# Patient Record
Sex: Male | Born: 1940 | Race: White | Hispanic: No | Marital: Married | Smoking: Former smoker
Health system: Southern US, Community
[De-identification: ages and names within clinical notes are randomized; demographics above are authoritative.]

## PROBLEM LIST (undated history)

## (undated) DIAGNOSIS — Z951 Presence of aortocoronary bypass graft: Secondary | ICD-10-CM

## (undated) DIAGNOSIS — Z789 Other specified health status: Secondary | ICD-10-CM

## (undated) DIAGNOSIS — R002 Palpitations: Secondary | ICD-10-CM

## (undated) DIAGNOSIS — Z87828 Personal history of other (healed) physical injury and trauma: Secondary | ICD-10-CM

## (undated) DIAGNOSIS — Z973 Presence of spectacles and contact lenses: Secondary | ICD-10-CM

## (undated) DIAGNOSIS — K08109 Complete loss of teeth, unspecified cause, unspecified class: Secondary | ICD-10-CM

## (undated) DIAGNOSIS — M1990 Unspecified osteoarthritis, unspecified site: Secondary | ICD-10-CM

## (undated) DIAGNOSIS — Z9889 Other specified postprocedural states: Secondary | ICD-10-CM

## (undated) DIAGNOSIS — I719 Aortic aneurysm of unspecified site, without rupture: Secondary | ICD-10-CM

## (undated) DIAGNOSIS — IMO0002 Reserved for concepts with insufficient information to code with codable children: Secondary | ICD-10-CM

## (undated) DIAGNOSIS — E119 Type 2 diabetes mellitus without complications: Secondary | ICD-10-CM

## (undated) DIAGNOSIS — I10 Essential (primary) hypertension: Secondary | ICD-10-CM

## (undated) DIAGNOSIS — G8929 Other chronic pain: Secondary | ICD-10-CM

## (undated) DIAGNOSIS — K5909 Other constipation: Secondary | ICD-10-CM

## (undated) DIAGNOSIS — K219 Gastro-esophageal reflux disease without esophagitis: Secondary | ICD-10-CM

## (undated) DIAGNOSIS — Z87898 Personal history of other specified conditions: Secondary | ICD-10-CM

## (undated) DIAGNOSIS — C61 Malignant neoplasm of prostate: Secondary | ICD-10-CM

## (undated) DIAGNOSIS — Z974 Presence of external hearing-aid: Secondary | ICD-10-CM

## (undated) DIAGNOSIS — Z9861 Coronary angioplasty status: Secondary | ICD-10-CM

## (undated) DIAGNOSIS — M549 Dorsalgia, unspecified: Secondary | ICD-10-CM

## (undated) DIAGNOSIS — I2510 Atherosclerotic heart disease of native coronary artery without angina pectoris: Secondary | ICD-10-CM

## (undated) DIAGNOSIS — E785 Hyperlipidemia, unspecified: Secondary | ICD-10-CM

## (undated) DIAGNOSIS — G894 Chronic pain syndrome: Secondary | ICD-10-CM

## (undated) HISTORY — PX: CORONARY ARTERY BYPASS GRAFT: SHX141

## (undated) HISTORY — DX: Essential (primary) hypertension: I10

## (undated) HISTORY — PX: LUMBAR SPINE SURGERY: SHX701

## (undated) HISTORY — DX: Atherosclerotic heart disease of native coronary artery without angina pectoris: I25.10

## (undated) HISTORY — PX: CORONARY ANGIOPLASTY: SHX604

## (undated) HISTORY — PX: CARDIAC CATHETERIZATION: SHX172

## (undated) HISTORY — DX: Reserved for concepts with insufficient information to code with codable children: IMO0002

## (undated) HISTORY — DX: Coronary angioplasty status: Z98.61

## (undated) HISTORY — PX: OTHER SURGICAL HISTORY: SHX169

## (undated) HISTORY — PX: ESOPHAGOGASTRODUODENOSCOPY (EGD) WITH ESOPHAGEAL DILATION: SHX5812

## (undated) HISTORY — PX: TONSILLECTOMY: SUR1361

## (undated) HISTORY — DX: Dorsalgia, unspecified: M54.9

## (undated) HISTORY — DX: Other chronic pain: G89.29

## (undated) HISTORY — DX: Personal history of other specified conditions: Z87.898

## (undated) HISTORY — DX: Hyperlipidemia, unspecified: E78.5

## (undated) HISTORY — DX: Other specified health status: Z78.9

## (undated) HISTORY — DX: Presence of aortocoronary bypass graft: Z95.1

## (undated) HISTORY — PX: KNEE ARTHROSCOPY: SUR90

---

## 1970-04-23 HISTORY — PX: OTHER SURGICAL HISTORY: SHX169

## 1986-04-23 HISTORY — PX: CORONARY ANGIOPLASTY: SHX604

## 1994-06-22 DIAGNOSIS — Z951 Presence of aortocoronary bypass graft: Secondary | ICD-10-CM | POA: Insufficient documentation

## 1994-06-22 HISTORY — DX: Presence of aortocoronary bypass graft: Z95.1

## 1994-07-04 HISTORY — PX: CARDIAC CATHETERIZATION: SHX172

## 1994-07-05 HISTORY — PX: CORONARY ARTERY BYPASS GRAFT: SHX141

## 2000-07-26 ENCOUNTER — Encounter: Payer: Self-pay | Admitting: Orthopedic Surgery

## 2000-07-26 ENCOUNTER — Encounter: Admission: RE | Admit: 2000-07-26 | Discharge: 2000-07-26 | Payer: Self-pay | Admitting: Orthopedic Surgery

## 2000-07-29 ENCOUNTER — Ambulatory Visit (HOSPITAL_BASED_OUTPATIENT_CLINIC_OR_DEPARTMENT_OTHER): Admission: RE | Admit: 2000-07-29 | Discharge: 2000-07-29 | Payer: Self-pay | Admitting: Orthopedic Surgery

## 2001-01-14 ENCOUNTER — Ambulatory Visit (HOSPITAL_COMMUNITY): Admission: RE | Admit: 2001-01-14 | Discharge: 2001-01-14 | Payer: Self-pay | Admitting: Cardiology

## 2001-01-14 ENCOUNTER — Encounter: Payer: Self-pay | Admitting: Cardiology

## 2004-01-11 ENCOUNTER — Ambulatory Visit (HOSPITAL_COMMUNITY): Admission: RE | Admit: 2004-01-11 | Discharge: 2004-01-11 | Payer: Self-pay | Admitting: Cardiology

## 2005-04-13 ENCOUNTER — Ambulatory Visit (HOSPITAL_COMMUNITY): Admission: RE | Admit: 2005-04-13 | Discharge: 2005-04-13 | Payer: Self-pay | Admitting: Cardiology

## 2005-05-22 ENCOUNTER — Ambulatory Visit (HOSPITAL_COMMUNITY): Admission: RE | Admit: 2005-05-22 | Discharge: 2005-05-22 | Payer: Self-pay | Admitting: Gastroenterology

## 2008-06-21 HISTORY — PX: NM MYOVIEW LTD: HXRAD82

## 2012-07-01 ENCOUNTER — Other Ambulatory Visit (HOSPITAL_COMMUNITY): Payer: Self-pay | Admitting: Cardiology

## 2012-07-01 DIAGNOSIS — I2510 Atherosclerotic heart disease of native coronary artery without angina pectoris: Secondary | ICD-10-CM

## 2012-07-01 DIAGNOSIS — I719 Aortic aneurysm of unspecified site, without rupture: Secondary | ICD-10-CM

## 2012-07-22 ENCOUNTER — Ambulatory Visit (HOSPITAL_COMMUNITY)
Admission: RE | Admit: 2012-07-22 | Discharge: 2012-07-22 | Disposition: A | Discharge status: Home / Self Care | Payer: Medicare Other | Source: Ambulatory Visit | Attending: Cardiology | Admitting: Cardiology

## 2012-07-22 DIAGNOSIS — E663 Overweight: Secondary | ICD-10-CM | POA: Insufficient documentation

## 2012-07-22 DIAGNOSIS — I10 Essential (primary) hypertension: Secondary | ICD-10-CM | POA: Insufficient documentation

## 2012-07-22 DIAGNOSIS — I2510 Atherosclerotic heart disease of native coronary artery without angina pectoris: Secondary | ICD-10-CM | POA: Insufficient documentation

## 2012-07-22 DIAGNOSIS — R5383 Other fatigue: Secondary | ICD-10-CM | POA: Insufficient documentation

## 2012-07-22 DIAGNOSIS — R5381 Other malaise: Secondary | ICD-10-CM | POA: Insufficient documentation

## 2012-07-22 DIAGNOSIS — I719 Aortic aneurysm of unspecified site, without rupture: Secondary | ICD-10-CM

## 2012-07-22 HISTORY — PX: OTHER SURGICAL HISTORY: SHX169

## 2012-07-22 MED ORDER — TECHNETIUM TC 99M SESTAMIBI GENERIC - CARDIOLITE
10.4000 | Freq: Once | INTRAVENOUS | Status: AC | PRN
  Administered 2012-07-22: 10 via INTRAVENOUS

## 2012-07-22 MED ORDER — REGADENOSON 0.4 MG/5ML IV SOLN
0.4000 mg | Freq: Once | INTRAVENOUS | Status: AC
  Administered 2012-07-22: 0.4 mg via INTRAVENOUS

## 2012-07-22 MED ORDER — TECHNETIUM TC 99M SESTAMIBI GENERIC - CARDIOLITE
30.5000 | Freq: Once | INTRAVENOUS | Status: AC | PRN
  Administered 2012-07-22: 31 via INTRAVENOUS

## 2012-07-22 NOTE — Procedures (Addendum)
[facility] [address] [place] [address] [phone]  Cardiology Nuclear Med Study  Sean Lucas is a 72 y.o. male     MRN : [id number]     DOB: 1940-12-15  Procedure Date: 07/22/2012  Nuclear Med Background Indication for Stress Test:  Graft Patency History:  CAD;MI;CABG X3-06/1994;STENT/PTCA-1988 Cardiac Risk Factors: Family History - CAD, History of Smoking, Hypertension, Lipids and Overweight  Symptoms:  Fatigue   Nuclear Pre-Procedure Caffeine/Decaff Intake:  12:00am NPO After: 10 AM   IV Site: R Antecubital  IV 0.9% NS with Angio Cath:  22g  Chest Size (in):  44" IV Started by: Sean Pomfret, RN  Height: 5\' 9"  (1.753 m)  Cup Size: n/a  BMI:  Body mass index is 28.64 kg/(m^2). Weight:  194 lb (87.998 kg)   Tech Comments:  N/A    Nuclear Med Study 1 or 2 day study: 1 day  Stress Test Type:  Lexiscan  Order Authorizing Provider:  Bryan Lemma, MD   Resting Radionuclide: Technetium 95m Sestamibi  Resting Radionuclide Dose: 10.4 mCi   Stress Radionuclide:  Technetium 102m Sestamibi  Stress Radionuclide Dose: 30.5 mCi           Stress Protocol Rest HR: 58 Stress HR: 76  Rest BP: 143/71 Stress BP: 148/63  Exercise Time (min): n/a METS: n/a   Predicted Max HR: 149 bpm % Max HR: 51.01 bpm Rate Pressure Product: 21308  Dose of Adenosine (mg):  n/a Dose of Lexiscan: 0.4 mg  Dose of Atropine (mg): n/a Dose of Dobutamine: n/a mcg/kg/min (at max HR)  Stress Test Technologist: Sean Lucas, CCT Nuclear Technologist: Sean Lucas, CNMT   Rest Procedure:  Myocardial perfusion imaging was performed at rest 45 minutes following the intravenous administration of Technetium 90m Sestamibi. Stress Procedure:  The patient received IV Lexiscan 0.4 mg over 15-seconds.  Technetium 53m Sestamibi injected at 30-seconds.  There were no significant changes with Lexiscan. The Patient experienced chest pressure and SOB; 75  mg. Of IV Aminophylline was administered with resolution of symptoms.  Quantitative spect images were obtained after a 45 minute delay.  Transient Ischemic Dilatation (Normal <1.22):  1.10 Lung/Heart Ratio (Normal <0.45):  0.29 QGS EDV:  69 ml QGS ESV:  27 ml LV Ejection Fraction: 61%  Signed by      Rest ECG: NSR - Normal EKG  Stress ECG: No significant change from baseline ECG  QPS Raw Data Images:  Normal; no motion artifact; normal heart/lung ratio. Stress Images:  Normal homogeneous uptake in all areas of the myocardium. Rest Images:  Normal homogeneous uptake in all areas of the myocardium. Subtraction (SDS):  No evidence of ischemia.  Impression Exercise Capacity:  Lexiscan with no exercise. BP Response:  Normal blood pressure response. Clinical Symptoms:  Mild chest pain/dyspnea. ECG Impression:  No significant ST segment change suggestive of ischemia. Comparison with Prior Nuclear Study: Compared to 2010, the previously described area of anterior wall "thinning" is not currently seen.  Overall Impression:  Normal stress nuclear study.  LV Wall Motion:  NL LV Function; NL Wall Motion   Sean Nass, MD  07/22/2012 5:37 PM

## 2012-11-18 ENCOUNTER — Emergency Department (HOSPITAL_COMMUNITY)
Admission: EM | Admit: 2012-11-18 | Discharge: 2012-11-18 | Disposition: A | Discharge status: Home / Self Care | Payer: Medicare Other | Attending: Emergency Medicine | Admitting: Emergency Medicine

## 2012-11-18 ENCOUNTER — Encounter (HOSPITAL_COMMUNITY): Payer: Self-pay | Admitting: Emergency Medicine

## 2012-11-18 ENCOUNTER — Emergency Department (HOSPITAL_COMMUNITY): Payer: Medicare Other

## 2012-11-18 DIAGNOSIS — R002 Palpitations: Secondary | ICD-10-CM | POA: Insufficient documentation

## 2012-11-18 DIAGNOSIS — Z951 Presence of aortocoronary bypass graft: Secondary | ICD-10-CM | POA: Insufficient documentation

## 2012-11-18 DIAGNOSIS — I10 Essential (primary) hypertension: Secondary | ICD-10-CM | POA: Insufficient documentation

## 2012-11-18 DIAGNOSIS — I2510 Atherosclerotic heart disease of native coronary artery without angina pectoris: Secondary | ICD-10-CM | POA: Insufficient documentation

## 2012-11-18 DIAGNOSIS — Z79899 Other long term (current) drug therapy: Secondary | ICD-10-CM | POA: Insufficient documentation

## 2012-11-18 DIAGNOSIS — Z8679 Personal history of other diseases of the circulatory system: Secondary | ICD-10-CM | POA: Insufficient documentation

## 2012-11-18 DIAGNOSIS — Z7982 Long term (current) use of aspirin: Secondary | ICD-10-CM | POA: Insufficient documentation

## 2012-11-18 DIAGNOSIS — Z791 Long term (current) use of non-steroidal anti-inflammatories (NSAID): Secondary | ICD-10-CM | POA: Insufficient documentation

## 2012-11-18 HISTORY — DX: Aortic aneurysm of unspecified site, without rupture: I71.9

## 2012-11-18 LAB — COMPREHENSIVE METABOLIC PANEL
ALT: 31 U/L (ref 0–53)
AST: 28 U/L (ref 0–37)
Albumin: 4.2 g/dL (ref 3.5–5.2)
Calcium: 10.1 mg/dL (ref 8.4–10.5)
Creatinine, Ser: 1.04 mg/dL (ref 0.50–1.35)
GFR calc non Af Amer: 70 mL/min — ABNORMAL LOW (ref >90)
Sodium: 139 mEq/L (ref 135–145)
Total Protein: 7.6 g/dL (ref 6.0–8.3)

## 2012-11-18 LAB — CBC WITH DIFFERENTIAL/PLATELET
Basophils Absolute: 0.1 10*3/uL (ref 0.0–0.1)
Basophils Relative: 1 % (ref 0–1)
Eosinophils Absolute: 0.4 10*3/uL (ref 0.0–0.7)
Eosinophils Relative: 4 % (ref 0–5)
HCT: 40.7 % (ref 39.0–52.0)
MCHC: 35.6 g/dL (ref 30.0–36.0)
MCV: 86.8 fL (ref 78.0–100.0)
Monocytes Absolute: 1 10*3/uL (ref 0.1–1.0)
Platelets: 284 10*3/uL (ref 150–400)
RDW: 13.7 % (ref 11.5–15.5)
WBC: 10.3 10*3/uL (ref 4.0–10.5)

## 2012-11-18 LAB — POCT I-STAT TROPONIN I

## 2012-11-18 NOTE — ED Provider Notes (Signed)
CSN: [id number]     Arrival date & time 11/18/12  0421 History     First MD Initiated Contact with Patient 11/18/12 [phone]     Chief Complaint  Patient presents with  . Palpitations   (Consider location/radiation/quality/duration/timing/severity/associated sxs/prior Treatment) HPI Comments: 72 year old male with a past medical history of aortic aneurysm and coronary arteriosclerosis status post CABG presents to the emergency department complaining of one episode of palpitations beginning about one hour prior to arrival to the emergency department around 3:00 this morning. Patient states he was laying in bed and felt abnormal heartbeats lasting for less than an hour, the symptoms subsided, however wife advised patient to the emergency department for evaluation. Denies ever having an episode like this in the past. Denies any associated symptoms including lightheadedness, dizziness, nausea, vomiting, diaphoresis, chest pain or shortness of breath. Denies having any stimulants prior to going to bed last night. No new recent medication changes. Last saw a cardiologist Dr. Herbie Baltimore with [facility] about 3 months back, had a normal stress test and was advised to return in one year. Currently patient is asymptomatic. The symptoms have not returned.  Patient is a 72 y.o. male presenting with palpitations. The history is provided by the patient and the spouse.  Palpitations Associated symptoms: no chest pain, no dizziness and no shortness of breath     Past Medical History  Diagnosis Date  . Aortic aneurysm   . Coronary arteriosclerosis    Past Surgical History  Procedure Laterality Date  . Coronary artery bypass graft     No family history on file. History  Substance Use Topics  . Smoking status: Never Smoker   . Smokeless tobacco: Not on file  . Alcohol Use: No    Review of Systems  Respiratory: Negative for shortness of breath.   Cardiovascular: Positive for palpitations.  Negative for chest pain.  Neurological: Negative for dizziness, syncope, weakness and light-headedness.  All other systems reviewed and are negative.    Allergies  Review of patient's allergies indicates no known allergies.  Home Medications   Current Outpatient Rx  Name  Route  Sig  Dispense  Refill  . aspirin 325 MG tablet   Oral   Take 325 mg by mouth daily.         . Cholecalciferol (VITAMIN D-3) 1000 UNITS CAPS   Oral   Take 1,000 Units by mouth daily.         . Coenzyme Q10 (CO Q 10 PO)   Oral   Take 200 mg by mouth daily.         . diazepam (VALIUM) 5 MG tablet   Oral   Take 5 mg by mouth 3 (three) times daily.         . Glucosamine HCl (GLUCOSAMINE PO)   Oral   Take 1,500 mg by mouth daily.         Marland Kitchen HYDROcodone-acetaminophen (NORCO) 7.5-325 MG per tablet   Oral   Take 1 tablet by mouth every 6 (six) hours as needed for pain.         Marland Kitchen lisinopril (PRINIVIL,ZESTRIL) 40 MG tablet   Oral   Take 40 mg by mouth daily.         . meloxicam (MOBIC) 7.5 MG tablet   Oral   Take 7.5 mg by mouth daily.         . niacin 500 MG tablet   Oral   Take 500 mg by mouth 3 (  three) times daily with meals.         . nitroGLYCERIN (NITROSTAT) 0.4 MG SL tablet   Sublingual   Place 0.4 mg under the tongue every 5 (five) minutes x 2 doses as needed for chest pain.         Marland Kitchen omeprazole (PRILOSEC) 20 MG capsule   Oral   Take 20 mg by mouth daily.         . propranolol (INDERAL) 20 MG tablet   Oral   Take 20 mg by mouth 3 (three) times daily.         . rosuvastatin (CRESTOR) 20 MG tablet   Oral   Take 20 mg by mouth daily.         Marland Kitchen venlafaxine (EFFEXOR) 75 MG tablet   Oral   Take 75 mg by mouth daily.          BP 146/75  Pulse 75  Temp(Src) 97.9 F (36.6 C) (Oral)  Resp 18  SpO2 93% Physical Exam  Nursing note and vitals reviewed. Constitutional: He is oriented to person, place, and time. He appears well-developed and  well-nourished. No distress.  HENT:  Head: Normocephalic and atraumatic.  Mouth/Throat: Oropharynx is clear and moist.  Eyes: Conjunctivae and EOM are normal. Pupils are equal, round, and reactive to light.  Neck: Normal range of motion. Neck supple.  Cardiovascular: Normal rate, regular rhythm and normal heart sounds.   Pulmonary/Chest: Effort normal and breath sounds normal.  Abdominal: Soft. Bowel sounds are normal. There is no tenderness.  Musculoskeletal: Normal range of motion. He exhibits no edema.  Neurological: He is alert and oriented to person, place, and time. He has normal strength. No sensory deficit.  Skin: Skin is warm and dry. He is not diaphoretic.  Psychiatric: He has a normal mood and affect. His behavior is normal.    ED Course   Procedures (including critical care time)  Labs Reviewed  COMPREHENSIVE METABOLIC PANEL - Abnormal; Notable for the following:    Glucose, Bld 108 (*)    GFR calc non Af Amer 70 (*)    GFR calc Af Amer 81 (*)    All other components within normal limits  CBC WITH DIFFERENTIAL  POCT I-STAT TROPONIN I    Date: 11/18/2012  Rate: 85  Rhythm: normal sinus rhythm  QRS Axis: normal  Intervals: normal  ST/T Wave abnormalities: normal  Conduction Disutrbances:none  Narrative Interpretation: normal EKG  Old EKG Reviewed: none available   Dg Chest 2 View  11/18/2012   *RADIOLOGY REPORT*  Clinical Data: Palpitations.  Irregular heart rate.  CHEST - 2 VIEW  Comparison: None.  Findings: Cardiomegaly.  CABG/median sternotomy.  No airspace disease.  No effusion.  Aortic arch atherosclerosis.  Thoracic spine degenerative disease.  Surgical clips are present in the upper abdomen.  IMPRESSION: Cardiomegaly without failure.   Original Report Authenticated By: Andreas Newport, M.D.   1. Palpitations     MDM  Patient with palpitations lasting less than 1 hour, asymptomatic in the ED. labs including CBC, BMP, troponin normal. EKG unremarkable.  Chest x-ray clear, cardiomegaly without failure. He will be discharged home with close followup with his cardiologist Dr. Herbie Baltimore for Holter monitor. Patient discussed with Dr. Dierdre Highman who agrees with plan of care.    Trevor Mace, PA-C 11/18/12 [phone]

## 2012-11-18 NOTE — ED Notes (Signed)
PT. REPORTS PALPITATIONS WHILE LYING ON BED THIS MORNING , DENIES CHEST PAIN OR SOB .

## 2012-11-19 NOTE — ED Provider Notes (Signed)
Medical screening examination/treatment/procedure(s) were performed by non-physician practitioner and as supervising physician I was immediately available for consultation/collaboration.  Ayona Yniguez, MD 11/19/12 0443 

## 2012-11-21 ENCOUNTER — Encounter: Payer: Self-pay | Admitting: Cardiology

## 2012-11-21 ENCOUNTER — Ambulatory Visit (INDEPENDENT_AMBULATORY_CARE_PROVIDER_SITE_OTHER): Payer: Medicare Other | Admitting: Cardiology

## 2012-11-21 VITALS — BP 142/82 | HR 63 | Ht 69.0 in | Wt 188.5 lb

## 2012-11-21 DIAGNOSIS — I2510 Atherosclerotic heart disease of native coronary artery without angina pectoris: Secondary | ICD-10-CM

## 2012-11-21 DIAGNOSIS — Z951 Presence of aortocoronary bypass graft: Secondary | ICD-10-CM

## 2012-11-21 DIAGNOSIS — I719 Aortic aneurysm of unspecified site, without rupture: Secondary | ICD-10-CM

## 2012-11-21 DIAGNOSIS — E785 Hyperlipidemia, unspecified: Secondary | ICD-10-CM

## 2012-11-21 DIAGNOSIS — R002 Palpitations: Secondary | ICD-10-CM

## 2012-11-21 DIAGNOSIS — I10 Essential (primary) hypertension: Secondary | ICD-10-CM

## 2012-11-21 NOTE — Patient Instructions (Addendum)
I am going to have you wear a monitor to see if we can figure out what the palpitations are.  You will have it for ~4 weeks unless we find something sooner.  Make sure that you cut back on your caffeine intake & stay hydrated (drink plenty of water).   Marykay Lex, MD

## 2012-11-28 DIAGNOSIS — R002 Palpitations: Secondary | ICD-10-CM

## 2012-12-10 ENCOUNTER — Encounter: Payer: Self-pay | Admitting: Cardiology

## 2012-12-10 DIAGNOSIS — I10 Essential (primary) hypertension: Secondary | ICD-10-CM | POA: Insufficient documentation

## 2012-12-10 DIAGNOSIS — I719 Aortic aneurysm of unspecified site, without rupture: Secondary | ICD-10-CM | POA: Insufficient documentation

## 2012-12-10 DIAGNOSIS — E785 Hyperlipidemia, unspecified: Secondary | ICD-10-CM | POA: Insufficient documentation

## 2012-12-10 DIAGNOSIS — I2510 Atherosclerotic heart disease of native coronary artery without angina pectoris: Secondary | ICD-10-CM | POA: Insufficient documentation

## 2012-12-10 NOTE — Progress Notes (Signed)
Patient ID: Sean Lucas, male   DOB: April 10, 1941, 72 y.o.   MRN: [id number] PCP: Melida Gimenez, MD  Clinic Note: Chief Complaint  Patient presents with  . Follow-up    post hosp ER visit,no chest pain ,no sob, no edema   HPI: Sean Lucas is a 72 y.o. male with a PMH below who presents today for followup of her recent hospital emergency room visit on July 29. He is a former long-term patient of Dr. Caprice Kluver, who I saw for the first time in March of 2014. He noted the baseline mild exertional dyspnea, and mild fatigue. His other major symptom at that time with headache symptoms that were currently being evaluated. As part of his followup evaluation for his coronary disease and with a LexiScan Myoview in April that was notably normal no evidence of ischemia or infarction and normal ejection fraction.  Interval History: He notes he been doing actually relatively well until a couple days before he went to the emergency room he started feeling intermittent episodes of dizziness with some mild palpitation symptoms like he had the past. He denied any true syncope but did feel as if he may pass out. On July 29, he went to the emergency room with the prolonged episode of palpitations lasting about an hour prior to his arrival. By the time he arrived to the emergency room, his symptoms had subsided. He described to me that he had been drinking some sodas during the course of that day, but didn't drink any more than usual. He says that the episodes he had leading up to and the time of his visit or more prominent than any other episodes of palpitations had in the past --they were just mild intermittent palpitations but none that lasted as long. In the emergency room, his heart rate was noted to be in the 55-85 beats per minute range. No arrhythmias noted.  Both now and the time of his evaluation, he denied any chest tightness or pressure associated with it. No dyspnea associated. He still has a mild  exertional dyspnea when he tries to get out and about fashion than usual. He thinks is more limited to the pain in his feet. He did feel a little bit dizzy initially episodes leading up to his presenting symptom, but he was not notably dizzy before going to the ER. Of course when the symptoms came on he was already lying in bed, so therefore he didn't notice if he felt as if he may need to sit down. He denied any true strokelike symptoms or TIA/amaurosis fugax symptoms. He never had any true syncopal type symptoms.  He denies any melena, hematochezia or hematuria, or nosebleeds. No claudication symptoms.  Past Medical History  Diagnosis Date  . Aortic aneurysm     By patient report  . CAD S/P percutaneous coronary angioplasty 1988    RCA PTCA in 1998 -- angina symptom = jaw and tooth pain  . 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 CAD in native artery     Three-vessel disease, normal wall motion normal ejection fraction.  . Dyslipidemia, goal LDL below 70   . Hypertension   . Chronic back pain   . History of palpitations     Related to anxiety, much improved -- true etiology not established  . Statin intolerance    Prior Cardiac Evaluation and Past Surgical History: Past Surgical History  Procedure Laterality  Date  . Coronary artery bypass graft  1996    LIMA-LAD, SVG-OM, SVG-dRCA  . Coronary angioplasty  1988    RCA  . Nm myoview ltd  March 2010    Small sized, mild intensity perfusion defect in the mid anterior wall, no reversibility. Either prior infarct or artifact. No evidence of ischemia  . Nm lexiscan myoview ltd  07/22/2012    Normal homogenous uptake, no ischemia or infarction. Previously noted anterior "thinning "not seen; EF 61%   No Known Allergies  Current Outpatient Prescriptions  Medication Sig Dispense Refill  . aspirin 325 MG tablet Take 325 mg by mouth daily.      . Cholecalciferol (VITAMIN D-3) 1000 UNITS CAPS  Take 1,000 Units by mouth daily.      . Coenzyme Q10 (CO Q 10 PO) Take 200 mg by mouth daily.      . diazepam (VALIUM) 5 MG tablet Take 5 mg by mouth 3 (three) times daily.      . fish oil-omega-3 fatty acids 1000 MG capsule Take 2 g by mouth daily.      . Glucosamine HCl (GLUCOSAMINE PO) Take 1,500 mg by mouth daily.      Marland Kitchen lisinopril (PRINIVIL,ZESTRIL) 40 MG tablet Take 40 mg by mouth daily.      . meloxicam (MOBIC) 7.5 MG tablet Take 7.5 mg by mouth daily.      . Multiple Vitamins-Minerals (MULTIVITAMIN WITH MINERALS) tablet Take 1 tablet by mouth daily.      . niacin 500 MG tablet Take 500 mg by mouth 3 (three) times daily with meals.      . nitroGLYCERIN (NITROSTAT) 0.4 MG SL tablet Place 0.4 mg under the tongue every 5 (five) minutes x 2 doses as needed for chest pain.      Marland Kitchen omeprazole (PRILOSEC) 20 MG capsule Take 20 mg by mouth daily.      . Oxycodone HCl 10 MG TABS       . propranolol (INDERAL) 20 MG tablet Take 20 mg by mouth 3 (three) times daily.      . rosuvastatin (CRESTOR) 20 MG tablet Take 20 mg by mouth daily.      Marland Kitchen venlafaxine (EFFEXOR) 75 MG tablet Take 75 mg by mouth daily.       No current facility-administered medications for this visit.   History   Social History Narrative   He is a married father of 8, grandfather to 37, great-grandfather of 4. He has a lot of difficulty with his feet. Walking on a treadmill really bothers his feet but he does try to be active. He denies any tobacco or alcohol use.     ROS: A comprehensive Review of Systems - Negative except Pertinent positives are noted above, other noncardiac issues below Gastrointestinal ROS: positive for - constipation Neurological ROS: no TIA or stroke symptoms positive for - gait disturbance and impaired coordination/balance; also foot pain is a chronic thing for him as well as back pain. No myalgias or arthralgias besides his back to  PHYSICAL EXAM BP 142/82  Pulse 63  Ht 5\' 9"  (1.753 m)  Wt 188 lb 8  oz (85.503 kg)  BMI 27.82 kg/m2 General, he is a very pleasant, healthy-appearing gentleman. NAD, A&O x 3; answers questions appropriately. He is well nourished, well groomed. HEENT: NCAT. EOMI. MMM. Anicteric sclerae. Neck: Supple with no LAN, JVD, or carotid bruit.  Heart: RRR. Normal S1, S2. No M/R/G. Nondisplaced PMI.  Lungs: CTAB. Nonlabored. Normal effort. Good  air movement.  Abdomen: Soft, nontender, nondistended; notable diastasis recti or possible incisional hernia from his previous surgeries. With this abnormality, it is difficult to palpate any pulsatile mass. Extremities: No C./C./E. Neuro- Psych: He has normal mood and affect, a little bit anxious. Otherwise neurologically intact, grossly normal  RUE:[id number] today: Yes Rate: 63 , Rhythm: Normal sinus, normal ECG.  Recent Labs: Chemistry panel 11/18/2012: Essentially normal, GFR was 70 otherwise normal with the exception of glucose of 108, CBC normal,  ASSESSMENT / PLAN:  Heart palpitations He has had palpitations as in the past, all were associated with anxiety type symptoms. This most recent one is a little different in that it lasted a longer time period he is not had any more episodes since then, so I cannot be sure to actually able to capture an episode on the monitor, however I would like to clarify what the rhythm was. My concern would be either atrial fibrillation or another more ominous rhythm. I don't think he would be consistent with an SVT, as he really didn't note much dizziness.  Plan: CardioNet Event Monitor times one month; followup after complete if any abdominal is noted. If symptoms reoccur outside the window of the monitor, and are concerning enough, would consider loop recorder implant.  S/P CABG x 3 He is remarkably stable overall. His bypass grafts are now close 72 years old, however his stress test remained normal, and he remains symptom free. He is on aspirin, ACE inhibitor and Inderal as a beta blocker.  This too is a beta blocker is to allow for combination treatment of his migraine headaches -- which have improved. He is also on Crestor, which he seems to be able to tolerate. Is also on niacin and fish oil.  Dyslipidemia, goal LDL below 70 Labs have not been checked. Plan was to have them checked at the time of his stress test, unfortunately labs are not in the system.  He would be due for having some labs checked, at least is chemistry panel appeared normal. If he is to see his primary physician in the interim before I see him back, perhaps they can be checked by his primary.  Otherwise we'll recheck him see him back.  He has continued to do well with dietary modifications and trying to pick up his activity level. He has lost another 6 pounds since his last visit. This is almost 10 pounds in about a year. He is still in the "overweight category "but notably improved.  Hypertension Borderline control on his current regimen. I'd like to see what his palpitations are to seek any intestines need to be made from a beta blocker standpoint the blood pressure is much better on last visit, but this is similar to the pressure he had emergency room.  Plan for now would continue to monitor and make further adjustments.  Aortic aneurysm - by report This is a diagnosed it was noted when he went to the emergency room. I was unable to see any documentation of this in our paper chart. This coronary disease, and dyslipidemia at his age it is not unreasonable to consider a screening abdominal ultrasound. I am somewhat concerned that his diastases recti/hernia who will make this difficult to get a good image. I will discuss it with my ultrasound tech, and if she feels it would be a doable thing, I will order it at the time of his next visit.    Orders Placed This Encounter  Procedures  . EKG 12-Lead  .  Cardiac event monitor    Standing Status: Future     Number of Occurrences: 1     Standing Expiration  Date: 11/21/2013    Followup: 6 months, unless any gross abnormalities noted on upcoming monitor.  DAVID W. Herbie Baltimore, M.D., M.S. [facility] [address] [place], [place]  [address]  [phone] Pager # [phone]

## 2012-12-11 DIAGNOSIS — R002 Palpitations: Secondary | ICD-10-CM | POA: Insufficient documentation

## 2012-12-11 NOTE — Assessment & Plan Note (Signed)
He has had palpitations as in the past, all were associated with anxiety type symptoms. This most recent one is a little different in that it lasted a longer time period he is not had any more episodes since then, so I cannot be sure to actually able to capture an episode on the monitor, however I would like to clarify what the rhythm was. My concern would be either atrial fibrillation or another more ominous rhythm. I don't think he would be consistent with an SVT, as he really didn't note much dizziness.  Plan: CardioNet Event Monitor times one month; followup after complete if any abdominal is noted. If symptoms reoccur outside the window of the monitor, and are concerning enough, would consider loop recorder implant.

## 2012-12-11 NOTE — Assessment & Plan Note (Signed)
He is remarkably stable overall. His bypass grafts are now close 72 years old, however his stress test remained normal, and he remains symptom free. He is on aspirin, ACE inhibitor and Inderal as a beta blocker. This too is a beta blocker is to allow for combination treatment of his migraine headaches -- which have improved. He is also on Crestor, which he seems to be able to tolerate. Is also on niacin and fish oil.

## 2012-12-11 NOTE — Assessment & Plan Note (Signed)
Borderline control on his current regimen. I'd like to see what his palpitations are to seek any intestines need to be made from a beta blocker standpoint the blood pressure is much better on last visit, but this is similar to the pressure he had emergency room.  Plan for now would continue to monitor and make further adjustments.

## 2012-12-11 NOTE — Assessment & Plan Note (Addendum)
Labs have not been checked. Plan was to have them checked at the time of his stress test, unfortunately labs are not in the system.  He would be due for having some labs checked, at least is chemistry panel appeared normal. If he is to see his primary physician in the interim before I see him back, perhaps they can be checked by his primary.  Otherwise we'll recheck him see him back.  He has continued to do well with dietary modifications and trying to pick up his activity level. He has lost another 6 pounds since his last visit. This is almost 10 pounds in about a year. He is still in the "overweight category "but notably improved.

## 2012-12-11 NOTE — Assessment & Plan Note (Signed)
This is a diagnosed it was noted when he went to the emergency room. I was unable to see any documentation of this in our paper chart. This coronary disease, and dyslipidemia at his age it is not unreasonable to consider a screening abdominal ultrasound. I am somewhat concerned that his diastases recti/hernia who will make this difficult to get a good image. I will discuss it with my ultrasound tech, and if she feels it would be a doable thing, I will order it at the time of his next visit.

## 2013-01-01 ENCOUNTER — Encounter: Payer: Self-pay | Admitting: Cardiology

## 2013-01-02 ENCOUNTER — Telehealth: Payer: Self-pay | Admitting: *Deleted

## 2013-01-02 NOTE — Telephone Encounter (Signed)
Message copied by Tobin Chad on Fri Jan 02, 2013  9:49 AM ------      Message from: [facility], DAVID      Created: Thu Jan 01, 2013 10:47 PM       Sinus rhythm rate 59-97            No PACs, PVCs or arrhythmias.            Marykay Lex, MD       ------

## 2013-01-02 NOTE — Telephone Encounter (Signed)
Spoke to wife.results given. continue with current medication.next appointment 05/2013

## 2013-02-24 ENCOUNTER — Other Ambulatory Visit: Payer: Self-pay

## 2013-02-24 MED ORDER — ROSUVASTATIN CALCIUM 20 MG PO TABS: 20.0000 mg | ORAL_TABLET | Freq: Every day | ORAL | Status: DC

## 2013-02-24 NOTE — Telephone Encounter (Signed)
Rx was sent to pharmacy electronically. 

## 2013-02-26 ENCOUNTER — Telehealth: Payer: Self-pay | Admitting: Cardiology

## 2013-02-26 NOTE — Telephone Encounter (Signed)
Spoke to patient.He states he had not received his results. RN informed patient that  the results were given to his wife on 01/13/13. RN gave results to patient. Verbalized understanding.

## 2013-02-26 NOTE — Telephone Encounter (Signed)
Sean Lucas is calling because he states he wore a heart monitor for 30 days and it has been three weeks since his 30 days has been up . He would like to know what is he suppose to do at this point .Marland Kitchen Please Call    Thanks

## 2013-03-16 ENCOUNTER — Other Ambulatory Visit: Payer: Self-pay | Admitting: *Deleted

## 2013-03-16 MED ORDER — LISINOPRIL 40 MG PO TABS: 40.0000 mg | ORAL_TABLET | Freq: Every day | ORAL | Status: DC

## 2013-03-30 ENCOUNTER — Other Ambulatory Visit: Payer: Self-pay | Admitting: *Deleted

## 2013-03-30 MED ORDER — ROSUVASTATIN CALCIUM 20 MG PO TABS: 20.0000 mg | ORAL_TABLET | Freq: Every day | ORAL | Status: AC

## 2013-03-30 NOTE — Telephone Encounter (Signed)
Refilled  crestor to [facility].

## 2013-04-13 ENCOUNTER — Other Ambulatory Visit: Payer: Self-pay | Admitting: *Deleted

## 2013-04-13 MED ORDER — LISINOPRIL 40 MG PO TABS: 40.0000 mg | ORAL_TABLET | Freq: Every day | ORAL | Status: DC

## 2013-04-13 NOTE — Telephone Encounter (Signed)
Rx was sent to pharmacy electronically. 

## 2013-07-23 ENCOUNTER — Encounter: Payer: Self-pay | Admitting: *Deleted

## 2013-07-27 ENCOUNTER — Ambulatory Visit (INDEPENDENT_AMBULATORY_CARE_PROVIDER_SITE_OTHER): Payer: Medicare HMO | Admitting: Cardiology

## 2013-07-27 ENCOUNTER — Encounter: Payer: Self-pay | Admitting: Cardiology

## 2013-07-27 VITALS — BP 100/72 | HR 66 | Ht 69.0 in | Wt 184.3 lb

## 2013-07-27 DIAGNOSIS — Z951 Presence of aortocoronary bypass graft: Secondary | ICD-10-CM

## 2013-07-27 DIAGNOSIS — R002 Palpitations: Secondary | ICD-10-CM

## 2013-07-27 DIAGNOSIS — Z79899 Other long term (current) drug therapy: Secondary | ICD-10-CM

## 2013-07-27 DIAGNOSIS — E785 Hyperlipidemia, unspecified: Secondary | ICD-10-CM

## 2013-07-27 DIAGNOSIS — I719 Aortic aneurysm of unspecified site, without rupture: Secondary | ICD-10-CM

## 2013-07-27 DIAGNOSIS — I2510 Atherosclerotic heart disease of native coronary artery without angina pectoris: Secondary | ICD-10-CM

## 2013-07-27 DIAGNOSIS — E559 Vitamin D deficiency, unspecified: Secondary | ICD-10-CM

## 2013-07-27 DIAGNOSIS — I10 Essential (primary) hypertension: Secondary | ICD-10-CM

## 2013-07-27 NOTE — Patient Instructions (Signed)
Labs needed CMP ,LIPIDS , VIT D LEVEL  Your physician wants you to follow-up in [place].  You will receive a reminder letter in the mail two months in advance. If you don't receive a letter, please call our office to schedule the follow-up appointment.

## 2013-07-28 ENCOUNTER — Encounter: Payer: Self-pay | Admitting: Cardiology

## 2013-07-28 LAB — LIPID PANEL
CHOL/HDL RATIO: 2.8 ratio
Cholesterol: 159 mg/dL (ref 0–200)
HDL: 57 mg/dL (ref >39)
LDL Cholesterol: 69 mg/dL (ref 0–99)
TRIGLYCERIDES: 167 mg/dL — AB (ref <150)
VLDL: 33 mg/dL (ref 0–40)

## 2013-07-28 LAB — COMPREHENSIVE METABOLIC PANEL
ALK PHOS: 83 U/L (ref 39–117)
ALT: 19 U/L (ref 0–53)
AST: 22 U/L (ref 0–37)
Albumin: 4.6 g/dL (ref 3.5–5.2)
BUN: 13 mg/dL (ref 6–23)
CALCIUM: 9.8 mg/dL (ref 8.4–10.5)
CO2: 27 mEq/L (ref 19–32)
CREATININE: 1.06 mg/dL (ref 0.50–1.35)
Chloride: 101 mEq/L (ref 96–112)
GLUCOSE: 95 mg/dL (ref 70–99)
Potassium: 6 mEq/L — ABNORMAL HIGH (ref 3.5–5.3)
Sodium: 139 mEq/L (ref 135–145)
Total Bilirubin: 0.3 mg/dL (ref 0.2–1.2)
Total Protein: 7.3 g/dL (ref 6.0–8.3)

## 2013-07-28 NOTE — Assessment & Plan Note (Signed)
Evaluate with abdominal ultrasound versus CT if followup visit.

## 2013-07-28 NOTE — Assessment & Plan Note (Signed)
No significant palpitations recently. Normal CardioNet monitor. He is on propranolol.

## 2013-07-28 NOTE — Assessment & Plan Note (Signed)
History of three-vessel disease with three-vessel CABG and negative stress test last year. No active anginal symptoms.  Plan: Continue aspirin, statin, beta blocker and ACE inhibitor.

## 2013-07-28 NOTE — Assessment & Plan Note (Signed)
Blood pressure is actually low at low today. With him having that one dizzy episode, I think we'll allow for a little bit more permissive hypertension.  Plan: Reduce dose of lisinopril to 20 mg

## 2013-07-28 NOTE — Progress Notes (Signed)
PCP: Noberto Retort, MD  Clinic Note: Chief Complaint  Patient presents with  . 6 months visit    no chest pain , no sob ,no edema,  had cataract surgery recently   HPI: Sean Lucas is a 73 y.o. male with a Cardiovascular Problem List below who presents today for a six-month followup of his CAD, palpitations and cardiac risk factors. I last saw him in August of 2014 at which time I ordered a CardioNet event monitor that was essentially bland with no real abdomen allergies. No arrhythmias and no significant PACs or PVCs.  Interval History: Since that last visit, he has done relatively well, noting that the palpitations are really not that significantly more. He said just one "dizzy spell in the last 8 months. It was somewhat positional in nature. He has definitely cut back on his caffeine intake but asked if he still is half one cup of coffee in the morning. He used to drink lots of tea during the course of days now switch to decaf. He still has intermittent GERD symptoms but denies any anginal chest tightness/pressure or dyspnea with rest or exertion. No PND, orthopnea with mild intermittent edema. He denies any lightheadedness, dizziness (with the one exception noted above), with no syncope/near-syncope or TIA/amaurosis fugax symptoms. No melena, hematochezia or hematuria. No epistaxis. No claudication.  Past Medical History  Diagnosis Date  . Aortic aneurysm     By patient report  . CAD S/P percutaneous coronary angioplasty 1988    RCA PTCA in 1998 -- angina symptom = jaw and tooth pain  . 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 CAD in native artery     Three-vessel disease, normal wall motion normal ejection fraction.  . Dyslipidemia, goal LDL below 70   . Hypertension   . Chronic back pain   . History of palpitations     Related to anxiety, much improved -- true etiology not established (No notabl eabnormalities on CardioNet -  12/2012)  . Statin intolerance   . Post-traumatic arthritis of lower leg      left lower leg suffered significant trauma with multiple surgeries to reconstruct the foot. This gives him chronic foot and lower leg pain. He walks with a limp.     Prior Cardiac Evaluation and Past Surgical History: Past Surgical History  Procedure Laterality Date  . Nm myoview ltd  March 2010    Small sized, mild intensity perfusion defect in the mid anterior wall, no reversibility. Either prior infarct or artifact. No evidence of ischemia  . Nm lexiscan myoview ltd  07/22/2012    Normal homogenous uptake, no ischemia or infarction. Previously noted anterior "thinning "not seen; EF 61%  . Coronary artery bypass graft  07/05/1994    LIMA-LAD, SVG-OM, SVG-dRCA  . Coronary angioplasty  1988    RCA  . Cardiac catheterization  07/04/1994    3-vessel disease normal wall motion and ejecton.--CABG x 3  . Cardionet event monitor   August-September 2014    Mostly NSR, occasional sinus bradycardia. No significant PACs, PVCs or arrhythmias.   MEDICATIONS AND ALLERGIES REVIEWED IN EPIC No Change in Social and Family History  ROS: A comprehensive Review of Systems - Negative except His chronic leg and foot pain. He also is having issues with his teeth having significant pain with cold foods or liquids and with breathing in and out through the teeth.  PHYSICAL EXAM BP 100/72  Pulse 66  Ht 5\' 9"  (1.753 m)  Wt 184 lb 4.8 oz (83.598 kg)  BMI 27.20 kg/m2 General appearance: alert, cooperative, appears stated age, no distress and borderline obese Neck: no adenopathy, no carotid bruit and no JVD Lungs: clear to auscultation bilaterally, normal percussion bilaterally and non-labored Heart: RRR. Normal S1, S2. No M/R/G. Nondisplaced PMI.  Abdomen: soft, non-tender; bowel sounds normal; no masses,  no organomegaly; notable diastasis recti or possible incisional hernia from his previous surgeries. Extremities: extremities  normal, atraumatic, no cyanosis, and edema; he wears thick workboots to support his left ankle. Walks with antalgic gait favoring the left foot Pulses: 2+ and symmetric; Neurologic: Mental status: Alert, oriented, thought content appropriate; Cranial nerves: normal (II-XII grossly intact)   Adult ECG Report  Rate: 66 ;  Rhythm: normal sinus rhythm and sinus arrhythmia  QRS Axis: 27 ;  PR Interval: 190 ;  QRS Duration: 92 ; QTc: 398  Voltages: Normal  Conduction Disturbances: none  Other Abnormalities: Mild/nonspecific ST-T wave changes in III and aVF   Narrative Interpretation: Essentially normal EKG  Recent Labs: None   ASSESSMENT / PLAN: Heart palpitations No significant palpitations recently. Normal CardioNet monitor. He is on propranolol.  CAD in native artery History of three-vessel disease with three-vessel CABG and negative stress test last year. No active anginal symptoms.  Plan: Continue aspirin, statin, beta blocker and ACE inhibitor.  Hypertension Blood pressure is actually low at low today. With him having that one dizzy episode, I think we'll allow for a little bit more permissive hypertension.  Plan: Reduce dose of lisinopril to 20 mg  Dyslipidemia, goal LDL below 70 I initially thought that her labs are being checked by his PCP. Unfortunately they were not. He is on Crestor 20 mg as well as a flaxseed oil and fish oil.  Plan: Check CMP and lipids. We'll also check vitamin D levels as he is on vitamin D, these will be for his PCP to evaluate and followup.  Aortic aneurysm - by report Evaluate with abdominal ultrasound versus CT if followup visit.    Orders Placed This Encounter  Procedures  . Comprehensive metabolic panel  . Lipid panel  . Vitamin D 1,25 dihydroxy  . EKG 12-Lead   Meds ordered this encounter  Medications  . Flaxseed, Linseed, (FLAX SEED OIL PO)    Sig: Take by mouth.    Followup: 6 months  Maryah Marinaro W. Ellyn Hack, M.D.,  M.S. Interventional Cardiologist [facility]

## 2013-07-28 NOTE — Assessment & Plan Note (Signed)
I initially thought that her labs are being checked by his PCP. Unfortunately they were not. He is on Crestor 20 mg as well as a flaxseed oil and fish oil.  Plan: Check CMP and lipids. We'll also check vitamin D levels as he is on vitamin D, these will be for his PCP to evaluate and followup.

## 2013-08-02 LAB — VITAMIN D 1,25 DIHYDROXY
VITAMIN D 1, 25 (OH) TOTAL: 51 pg/mL (ref 18–72)
Vitamin D2 1, 25 (OH)2: <8 pg/mL
Vitamin D3 1, 25 (OH)2: 51 pg/mL

## 2014-03-22 ENCOUNTER — Ambulatory Visit (INDEPENDENT_AMBULATORY_CARE_PROVIDER_SITE_OTHER): Payer: Medicare HMO | Admitting: Cardiology

## 2014-03-22 ENCOUNTER — Encounter: Payer: Self-pay | Admitting: Cardiology

## 2014-03-22 VITALS — BP 142/70 | HR 63 | Ht 69.0 in | Wt 193.6 lb

## 2014-03-22 DIAGNOSIS — I719 Aortic aneurysm of unspecified site, without rupture: Secondary | ICD-10-CM

## 2014-03-22 DIAGNOSIS — Z951 Presence of aortocoronary bypass graft: Secondary | ICD-10-CM

## 2014-03-22 DIAGNOSIS — E785 Hyperlipidemia, unspecified: Secondary | ICD-10-CM

## 2014-03-22 DIAGNOSIS — I10 Essential (primary) hypertension: Secondary | ICD-10-CM

## 2014-03-22 DIAGNOSIS — R002 Palpitations: Secondary | ICD-10-CM

## 2014-03-22 DIAGNOSIS — Z79899 Other long term (current) drug therapy: Secondary | ICD-10-CM

## 2014-03-22 DIAGNOSIS — I2510 Atherosclerotic heart disease of native coronary artery without angina pectoris: Secondary | ICD-10-CM

## 2014-03-22 NOTE — Patient Instructions (Addendum)
Labs in April 2016- CMP-LIPID will send lab slip in the mail.  Your physician wants you to follow-up in 6 month DR HARDING. You will receive a reminder letter in the mail two months in advance. If you don't receive a letter, please call our office to schedule the follow-up appointment.

## 2014-03-23 ENCOUNTER — Encounter: Payer: Self-pay | Admitting: Cardiology

## 2014-03-23 NOTE — Assessment & Plan Note (Signed)
The plan was to have this evaluated with abdominal CT scan versus ultrasound. I will simply have him have an abdominal ultrasound prior to his six-month followup visit. This was not told him during his visit, he will be notified via telephone.

## 2014-03-23 NOTE — Assessment & Plan Note (Signed)
73 year old grafts, he remains remarkably stable. He has had 2 negative stress tests in the past 5 years. He would not necessarily need another one until 2019 unless he had more symptoms.

## 2014-03-23 NOTE — Assessment & Plan Note (Signed)
Borderline pressures today. His last pressure during his visit in April were low so I reduced his lisinopril to 20 mg. He is not having nearly daily episodes of this pressure in this range.  I feel that mild permissive hypertension is reasonable.

## 2014-03-23 NOTE — Progress Notes (Signed)
PCP: Pollyann Samples, MD  Clinic Note: Chief Complaint  Patient presents with  . 6 month visit    no chest pain , no sob, some edema  . Coronary Artery Disease    CABG   HPI: Sean Lucas is a 73 y.o. male with a PMH below who presents today for 58 month old with CAD and CABG as well as dyslipidemia with statin intolerance, and hypertension.  He was supposed to have her lipids checked before this visit, but that was not done. He is long-term primary care physician has retired, and now is reestablishing with a new primary physician. He is having issues with activity had been on Valium for quite some time these he has a when necessary for sleep as well as anxiety.  Past Medical History  Diagnosis Date  . Aortic aneurysm     By patient report  . CAD S/P percutaneous coronary angioplasty 1988    RCA PTCA in 1998 -- angina symptom = jaw and tooth pain  . 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 CAD in native artery     Three-vessel disease, normal wall motion normal ejection fraction.  . Dyslipidemia, goal LDL below 70   . Hypertension   . Chronic back pain   . History of palpitations     Related to anxiety, much improved -- true etiology not established (No notabl eabnormalities on CardioNet - 12/2012)  . Statin intolerance   . Post-traumatic arthritis of lower leg      left lower leg suffered significant trauma with multiple surgeries to reconstruct the foot. This gives him chronic foot and lower leg pain. He walks with a limp.     Prior Cardiac Evaluation and Procedural History: Past Surgical History  Procedure Laterality Date  . Nm myoview ltd  March 2010    Small sized, mild intensity perfusion defect in the mid anterior wall, no reversibility. Either prior infarct or artifact. No evidence of ischemia  . Nm lexiscan myoview ltd  07/22/2012    Normal homogenous uptake, no ischemia or infarction. Previously noted anterior  "thinning "not seen; EF 61%  . Coronary artery bypass graft  07/05/1994    LIMA-LAD, SVG-OM, SVG-dRCA  . Coronary angioplasty  1988    RCA  . Cardiac catheterization  07/04/1994    3-vessel disease normal wall motion and ejecton.--CABG x 3  . Cardionet event monitor  8-9 2014    Mostly NSR, occasional sinus bradycardia. No significant PACs, PVCs or arrhythmias.   Interval History: He presents today really with no cardiac complaints besides mild lower extremity edema at the end of the day. He also describes brief spells of shortness of breath that are not associated with exertion, but more associated with stress and anxiety. He feels as though he has a hard time catching his breath is gasping for his breath. He didn't want to take a deep breath in and goes out times he is able to return back to normal. It is not associated all excess tightness or pressure. Besides these episodes, he denies any symptoms of chest pain or shortness of breath with rest or exertion. No heart failure symptoms of PND, orthopnea or edema.  He notes intermittent palpitations, worse with anxiety but nothing prolonged or sustained.  He denies any weakness,  syncope/near syncope, or No TIA/amaurosis fugax symptoms.  He tells me that these short episodes of gasping for breath are  usually well controlled weighs taking his as needed Valium, if he takes it every day he doesn't have these episodes at all.  ROS: A comprehensive was performed. Review of Systems  Constitutional: Negative for weight loss.  HENT: Negative for nosebleeds.   Respiratory: Positive for shortness of breath. Negative for cough and wheezing.   Cardiovascular: Negative for claudication.  Gastrointestinal: Negative for blood in stool and melena.  Genitourinary: Negative for hematuria.  Musculoskeletal: Negative for myalgias, joint pain and falls.       He is extremely pain in both feet and ankles from his previous accidents/injuries. Basically the pain is  significant enough that he really can't do much in the way of any walking or exercise that involves walking.  Neurological: Negative for dizziness.  Endo/Heme/Allergies: Does not bruise/bleed easily.  Psychiatric/Behavioral: Negative for depression and memory loss. The patient is nervous/anxious and has insomnia.   All other systems reviewed and are negative.   Current Outpatient Prescriptions on File Prior to Visit  Medication Sig Dispense Refill  . aspirin 325 MG tablet Take 325 mg by mouth daily.    . Cholecalciferol (VITAMIN D-3) 1000 UNITS CAPS Take 1,000 Units by mouth daily.    . diazepam (VALIUM) 5 MG tablet Take 5 mg by mouth 3 (three) times daily.    . fish oil-omega-3 fatty acids 1000 MG capsule Take 2 g by mouth daily.    . Flaxseed, Linseed, (FLAX SEED OIL PO) Take by mouth.    . Glucosamine HCl (GLUCOSAMINE PO) Take 1,500 mg by mouth daily.    Marland Kitchen lisinopril (PRINIVIL,ZESTRIL) 40 MG tablet Take 1 tablet (40 mg total) by mouth daily. 30 tablet 9  . meloxicam (MOBIC) 7.5 MG tablet Take 7.5 mg by mouth daily.    . Multiple Vitamins-Minerals (MULTIVITAMIN WITH MINERALS) tablet Take 1 tablet by mouth daily.    . nitroGLYCERIN (NITROSTAT) 0.4 MG SL tablet Place 0.4 mg under the tongue every 5 (five) minutes x 2 doses as needed for chest pain.    Marland Kitchen omeprazole (PRILOSEC) 20 MG capsule Take 20 mg by mouth daily.    . Oxycodone HCl 10 MG TABS Take 15 mg by mouth 2 (two) times daily.     . propranolol (INDERAL) 20 MG tablet Take 20 mg by mouth 2 (two) times daily.     . rosuvastatin (CRESTOR) 20 MG tablet Take 1 tablet (20 mg total) by mouth daily. 30 tablet 6  . venlafaxine (EFFEXOR) 75 MG tablet Take 75 mg by mouth daily.     No current facility-administered medications on file prior to visit.   ALLERGIES REVIEWED IN EPIC -- No change SOCIAL AND FAMILY HISTORY REVIEWED IN EPIC -- NO change  Wt Readings from Last 3 Encounters:  03/22/14 193 lb 9.6 oz (87.816 kg)  07/27/13 184 lb 4.8  oz (83.598 kg)  11/21/12 188 lb 8 oz (85.503 kg)    PHYSICAL EXAM BP 142/70 mmHg  Pulse 63  Ht 5\' 9"  (1.753 m)  Wt 193 lb 9.6 oz (87.816 kg)  BMI 28.58 kg/m2 General appearance: alert, cooperative, appears stated age, no distress and borderline obese HEENT: [place]/AT, EOMI, MMM, anicteric sclera Neck: no adenopathy, no carotid bruit and no JVD Lungs: clear to auscultation bilaterally, normal percussion bilaterally and non-labored Heart: RRR. Normal S1, S2. No M/R/G. Nondisplaced PMI.  Abdomen: soft, non-tender; bowel sounds normal; no masses, no organomegaly; notable diastasis recti or possible incisional hernia from his previous surgeries. Extremities: extremities normal, atraumatic, no cyanosis, and  edema; he wears thick workboots to support his left ankle. Walks with antalgic gait favoring the left foot Pulses: 2+ and symmetric; Neurologic: Mental status: Alert, oriented, thought content appropriate; Cranial nerves: normal (II-XII grossly intact)   Adult ECG Report  Rate: 63 ;  Rhythm: normal sinus rhythm; CRO Anterior MI, age undetermined  Narrative Interpretation: stable EKG  Recent Labs:  None since April  ASSESSMENT / PLAN: MV CAD - s/p CABG No active anginal symptoms at present. No heart failure symptoms. He is on aspirin and we can reduce to 81 mg. He is taking lisinopril 20 mg as well as propranolol for beta blocker. He takes a steady dose of Crestor.  Stable regimen, continue no changes.  The one thing that I would like to see him do his get some more exercise. With adequate pain control of his lower leg and foot pain, he isn't able doing walking. I will defer this management to his PCP, hopefully they will get in synch with how to manage his anxiety and pain levels.  S/P CABG x 5 73 year old grafts, he remains remarkably stable. He has had 2 negative stress tests in the past 5 years. He would not necessarily need another one until 2019 unless he had more  symptoms.  Essential hypertension Borderline pressures today. His last pressure during his visit in April were low so I reduced his lisinopril to 20 mg. He is not having nearly daily episodes of this pressure in this range.  I feel that mild permissive hypertension is reasonable.  Dyslipidemia, goal LDL below 70 His last set of labs in April were pretty much at goal. He should begin to check him again prior to six-month followup. He can have labs checked in roughly April timeframe. Continue Crestor plus flaxseed.  Aortic aneurysm - by report The plan was to have this evaluated with abdominal CT scan versus ultrasound. I will simply have him have an abdominal ultrasound prior to his six-month followup visit. This was not told him during his visit, he will be notified via telephone.  Heart palpitations Stable on propranolol.  He seems to have more these episodes all windows dyspnea episodes associated with anxiety. Pretty much he is dependent on his as needed Valium dose.  As long as he takes that he doesn't have the spells.    Orders Placed This Encounter  Procedures  . Comprehensive metabolic panel    Standing Status: Future     Number of Occurrences:      Standing Expiration Date: 03/22/2016    Order Specific Question:  Has the patient fasted?    Answer:  Yes  . Lipid panel    Standing Status: Future     Number of Occurrences:      Standing Expiration Date: 03/22/2016    Order Specific Question:  Has the patient fasted?    Answer:  Yes  . EKG 12-Lead   Meds ordered this encounter  Medications  . Sennosides 15 MG TABS    Sig:      Followup: 6 months   HARDING,DAVID W, M.D., M.S. Interventional Cardiologist   Pager # [phone]

## 2014-03-23 NOTE — Assessment & Plan Note (Signed)
His last set of labs in April were pretty much at goal. He should begin to check him again prior to six-month followup. He can have labs checked in roughly April timeframe. Continue Crestor plus flaxseed.

## 2014-03-23 NOTE — Assessment & Plan Note (Addendum)
No active anginal symptoms at present. No heart failure symptoms. He is on aspirin and we can reduce to 81 mg. He is taking lisinopril 20 mg as well as propranolol for beta blocker. He takes a steady dose of Crestor.  Stable regimen, continue no changes.  The one thing that I would like to see him do his get some more exercise. With adequate pain control of his lower leg and foot pain, he isn't able doing walking. I will defer this management to his PCP, hopefully they will get in synch with how to manage his anxiety and pain levels.

## 2014-03-23 NOTE — Assessment & Plan Note (Signed)
Stable on propranolol.  He seems to have more these episodes all windows dyspnea episodes associated with anxiety. Pretty much he is dependent on his as needed Valium dose.  As long as he takes that he doesn't have the spells.

## 2014-07-21 ENCOUNTER — Telehealth: Payer: Self-pay | Admitting: *Deleted

## 2014-07-21 DIAGNOSIS — E785 Hyperlipidemia, unspecified: Secondary | ICD-10-CM

## 2014-07-21 DIAGNOSIS — Z79899 Other long term (current) drug therapy: Secondary | ICD-10-CM

## 2014-07-21 DIAGNOSIS — I719 Aortic aneurysm of unspecified site, without rupture: Secondary | ICD-10-CM

## 2014-07-21 NOTE — Telephone Encounter (Signed)
-----   Message from Raiford Simmonds, RN sent at 03/22/2014  5:18 PM EST ----- LABS CMP,LIPID  DO LABS IN EARLY April 2016

## 2014-07-21 NOTE — Telephone Encounter (Signed)
SPOKE PATIENT  HE IS AWARE OF LABS , DOPPLER , AND TO MAKE AN UPCOMING APPOINTMENT IN MAY OR  June 2016

## 2014-07-21 NOTE — Telephone Encounter (Signed)
Called patient - no answer will try again  Mail letter and lab slip  CMP, LIPID SCHEDULE AAA ULTRASOUND PRIOR  6 MONTH APPOINTMENT

## 2014-07-27 ENCOUNTER — Telehealth (HOSPITAL_COMMUNITY): Payer: Self-pay | Admitting: *Deleted

## 2014-08-04 ENCOUNTER — Encounter (HOSPITAL_COMMUNITY): Payer: Medicare HMO

## 2014-08-11 ENCOUNTER — Ambulatory Visit (HOSPITAL_COMMUNITY)
Admission: RE | Admit: 2014-08-11 | Discharge: 2014-08-11 | Disposition: A | Discharge status: Home / Self Care | Payer: Medicare HMO | Source: Ambulatory Visit | Attending: Cardiology | Admitting: Cardiology

## 2014-08-11 ENCOUNTER — Telehealth: Payer: Self-pay | Admitting: Cardiology

## 2014-08-11 DIAGNOSIS — I719 Aortic aneurysm of unspecified site, without rupture: Secondary | ICD-10-CM | POA: Insufficient documentation

## 2014-08-11 DIAGNOSIS — R0989 Other specified symptoms and signs involving the circulatory and respiratory systems: Secondary | ICD-10-CM | POA: Diagnosis not present

## 2014-08-11 NOTE — Telephone Encounter (Signed)
Pt presented for labwork. Advised based on last telephone notes, he has already had appropriate labwork & no further indications. He presented there prior to Abd aorta US sched here for 2:30. Advised to send pt up for scan.

## 2014-08-11 NOTE — Progress Notes (Signed)
Abdominal Aorta Duplex Completed. Eustolia Drennen, BS, RDMS, RVT  

## 2014-08-16 ENCOUNTER — Telehealth: Payer: Self-pay | Admitting: *Deleted

## 2014-08-16 NOTE — Telephone Encounter (Signed)
-----   Message from Leonie Man, MD sent at 08/16/2014  2:15 PM EDT ----- No evidence of abdominal aortic aneurysm on abdominal Dopplers.  DH   please forward to Dr.Dean ( PCP)

## 2014-08-16 NOTE — Telephone Encounter (Signed)
Spoke to patient. Result given . Verbalized understanding Patient states he sees   Sean Lucas  [place].   [address]  [id number]  [place] [phone] And not Dr Marlou Sa. Information faxed to [facility]

## 2014-08-16 NOTE — Progress Notes (Signed)
Quick Note:  No evidence of abdominal aortic aneurysm on abdominal Dopplers.  DH   please forward to Dr.Dean ( PCP) ______

## 2015-03-21 ENCOUNTER — Telehealth: Payer: Self-pay | Admitting: Internal Medicine

## 2015-03-21 NOTE — Telephone Encounter (Signed)
Pt is a 74 yo M w/ a PMH notable for an irregular heart beat and HL. Called tonight saying irregular heart beat. HRs have been 70s. Some early beats. Did not check BP today. No CP or SOB. Does not feel lightheaded. Instructed pt to call PCP or cardiologists in AM for EKG if HR still irregular.  Sean Schlichter, MD

## 2015-03-22 ENCOUNTER — Encounter: Payer: Self-pay | Admitting: Nurse Practitioner

## 2015-03-22 ENCOUNTER — Telehealth: Payer: Self-pay | Admitting: Cardiology

## 2015-03-22 ENCOUNTER — Ambulatory Visit (INDEPENDENT_AMBULATORY_CARE_PROVIDER_SITE_OTHER): Payer: Medicare HMO | Admitting: Nurse Practitioner

## 2015-03-22 VITALS — BP 170/90 | HR 72 | Ht 69.0 in | Wt 183.1 lb

## 2015-03-22 DIAGNOSIS — I259 Chronic ischemic heart disease, unspecified: Secondary | ICD-10-CM

## 2015-03-22 DIAGNOSIS — R002 Palpitations: Secondary | ICD-10-CM

## 2015-03-22 LAB — CBC
HCT: 41.6 % (ref 39.0–52.0)
Hemoglobin: 14.6 g/dL (ref 13.0–17.0)
MCH: 31.4 pg (ref 26.0–34.0)
MCHC: 35.1 g/dL (ref 30.0–36.0)
MCV: 89.5 fL (ref 78.0–100.0)
MPV: 9.3 fL (ref 8.6–12.4)
Platelets: 283 10*3/uL (ref 150–400)
RBC: 4.65 MIL/uL (ref 4.22–5.81)
RDW: 14.2 % (ref 11.5–15.5)
WBC: 9.3 10*3/uL (ref 4.0–10.5)

## 2015-03-22 LAB — BASIC METABOLIC PANEL
BUN: 13 mg/dL (ref 7–25)
CO2: 28 mmol/L (ref 20–31)
Calcium: 9.4 mg/dL (ref 8.6–10.3)
Chloride: 99 mmol/L (ref 98–110)
Creat: 0.99 mg/dL (ref 0.70–1.18)
Glucose, Bld: 108 mg/dL — ABNORMAL HIGH (ref 65–99)
Potassium: 5.1 mmol/L (ref 3.5–5.3)
Sodium: 136 mmol/L (ref 135–146)

## 2015-03-22 LAB — TSH: TSH: 1.016 u[IU]/mL (ref 0.350–4.500)

## 2015-03-22 NOTE — Progress Notes (Signed)
CARDIOLOGY OFFICE NOTE  Date:  03/22/2015    Sean Lucas Date of Birth: April 19, 1941 Medical Record [id number]  PCP:  Antonietta Jewel, MD  Cardiologist:  Ellyn Hack    Chief Complaint  Patient presents with  . Palpitations    Work in visit - seen for Dr. Ellyn Hack    History of Present Illness: Sean Lucas is a 74 y.o. male who presents today for a work in visit. Seen for Dr. Ellyn Hack.   He has not been seen in over one year.   He has a history of CAD with prior CABG in 1996 with LIMA to LAD, SVG to OM, SVG to RCA per EBG, HTN, palpitations & HLD. He had a basically normal event monitor back in August of 2014.   Comes in today. Here with his wife. Has had onset of palpitations - started Sunday night. Feels more if turning over in the bed. Could hear his heart beating in his ears. Noted his heart skipping. No coffee. Does like chocolate. BP is up here today. Last night 150/80. Recently started on Pamelor - this is for chronic pain. Wife also bought his diet soda that was not decaff. No chest/jaw pain. Remains active. No passing out.   Past Medical History  Diagnosis Date  . Aortic aneurysm ([place])     By patient report  . CAD S/P percutaneous coronary angioplasty 1988    RCA PTCA in 1998 -- angina symptom = jaw and tooth pain  . 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 CAD in native artery     Three-vessel disease, normal wall motion normal ejection fraction.  . Dyslipidemia, goal LDL below 70   . Hypertension   . Chronic back pain   . History of palpitations     Related to anxiety, much improved -- true etiology not established (No notabl eabnormalities on CardioNet - 12/2012)  . Statin intolerance   . Post-traumatic arthritis of lower leg      left lower leg suffered significant trauma with multiple surgeries to reconstruct the foot. This gives him chronic foot and lower leg pain. He walks with a limp.     Past Surgical  History  Procedure Laterality Date  . Nm myoview ltd  March 2010    Small sized, mild intensity perfusion defect in the mid anterior wall, no reversibility. Either prior infarct or artifact. No evidence of ischemia  . Nm lexiscan myoview ltd  07/22/2012    Normal homogenous uptake, no ischemia or infarction. Previously noted anterior "thinning "not seen; EF 61%  . Coronary artery bypass graft  07/05/1994    LIMA-LAD, SVG-OM, SVG-dRCA  . Coronary angioplasty  1988    RCA  . Cardiac catheterization  07/04/1994    3-vessel disease normal wall motion and ejecton.--CABG x 3  . Cardionet event monitor  8-9 2014    Mostly NSR, occasional sinus bradycardia. No significant PACs, PVCs or arrhythmias.     Medications: Current Outpatient Prescriptions  Medication Sig Dispense Refill  . aspirin 325 MG tablet Take 325 mg by mouth daily.    . Cholecalciferol (VITAMIN D-3) 1000 UNITS CAPS Take 1,000 Units by mouth daily.    . diazepam (VALIUM) 5 MG tablet Take 5 mg by mouth 3 (three) times daily.    . fish oil-omega-3 fatty acids 1000 MG capsule Take 2 g by mouth daily.    . Flaxseed, Linseed, (FLAX SEED  OIL PO) Take by mouth.    . Glucosamine HCl (GLUCOSAMINE PO) Take 1,500 mg by mouth daily.    Marland Kitchen lisinopril (PRINIVIL,ZESTRIL) 40 MG tablet Take 1 tablet (40 mg total) by mouth daily. 30 tablet 9  . meloxicam (MOBIC) 7.5 MG tablet Take 7.5 mg by mouth daily.    . Multiple Vitamins-Minerals (MULTIVITAMIN WITH MINERALS) tablet Take 1 tablet by mouth daily.    . nitroGLYCERIN (NITROSTAT) 0.4 MG SL tablet Place 0.4 mg under the tongue every 5 (five) minutes x 2 doses as needed for chest pain.    . nortriptyline (PAMELOR) 25 MG capsule Take 25 mg by mouth at bedtime.     Marland Kitchen omeprazole (PRILOSEC) 20 MG capsule Take 20 mg by mouth daily.    . Oxycodone HCl 10 MG TABS Take 15 mg by mouth 2 (two) times daily.     . propranolol (INDERAL) 20 MG tablet Take 20 mg by mouth 2 (two) times daily.     . psyllium  (REGULOID) 0.52 G capsule Take 0.52 g by mouth daily.    . rosuvastatin (CRESTOR) 20 MG tablet Take 1 tablet (20 mg total) by mouth daily. 30 tablet 6  . Sennosides 15 MG TABS     . venlafaxine (EFFEXOR) 75 MG tablet Take 75 mg by mouth daily.     No current facility-administered medications for this visit.    Allergies: Allergies  Allergen Reactions  . Lipitor [Atorvastatin] Other (See Comments)    myalgias  . Simvastatin Other (See Comments)    myalgias    Social History: The patient  reports that he has never smoked. He does not have any smokeless tobacco history on file. He reports that he does not drink alcohol or use illicit drugs.   Family History: The patient's family history includes Cancer - Lung in his father; Leukemia in his mother and sister.   Review of Systems: Please see the history of present illness.   Otherwise, the review of systems is positive for none.   All other systems are reviewed and negative.   Physical Exam: VS:  BP 170/90 mmHg  Pulse 72  Ht 5\' 9"  (1.753 m)  Wt 183 lb 1.9 oz (83.063 kg)  BMI 27.03 kg/m2 .  BMI Body mass index is 27.03 kg/(m^2).  Wt Readings from Last 3 Encounters:  03/22/15 183 lb 1.9 oz (83.063 kg)  03/22/14 193 lb 9.6 oz (87.816 kg)  07/27/13 184 lb 4.8 oz (83.598 kg)   Repeat BP by me is down to 130/60.   General: Pleasant. Well developed, well nourished and in no acute distress.  HEENT: Normal. Neck: Supple, no JVD, carotid bruits, or masses noted.  Cardiac: Regular rate and rhythm. No murmurs, rubs, or gallops. No edema.  Respiratory:  Lungs are clear to auscultation bilaterally with normal work of breathing.  GI: Soft and nontender.  MS: No deformity or atrophy. Gait and ROM intact. Skin: Warm and dry. Color is normal.  Neuro:  Strength and sensation are intact and no gross focal deficits noted.  Psych: Alert, appropriate and with normal affect.   LABORATORY DATA:  EKG:  EKG is ordered today. This demonstrates  NSR.  Lab Results  Component Value Date   WBC 10.3 11/18/2012   HGB 14.5 11/18/2012   HCT 40.7 11/18/2012   PLT 284 11/18/2012   GLUCOSE 95 07/27/2013   CHOL 159 07/27/2013   TRIG 167* 07/27/2013   HDL 57 07/27/2013   LDLCALC 69 07/27/2013  ALT 19 07/27/2013   AST 22 07/27/2013   NA 139 07/27/2013   K 6.0* 07/27/2013   CL 101 07/27/2013   CREATININE 1.06 07/27/2013   BUN 13 07/27/2013   CO2 27 07/27/2013    BNP (last 3 results) No results for input(s): BNP in the last 8760 hours.  ProBNP (last 3 results) No results for input(s): PROBNP in the last 8760 hours.   Other Studies Reviewed Today:  Myoview Impression from 07/2012 Exercise Capacity: Lexiscan with no exercise. BP Response: Normal blood pressure response. Clinical Symptoms: Mild chest pain/dyspnea. ECG Impression: No significant ST segment change suggestive of ischemia. Comparison with Prior Nuclear Study: Compared to 2010, the previously described area of anterior wall "thinning" is not currently seen.  Overall Impression: Normal stress nuclear study.  LV Wall Motion: NL LV Function; NL Wall Motion   CROITORU,MIHAI, MD  07/22/2012 5:37 PM  Assessment/Plan: 1. Palpitations - suspect this is multifactorial in etiology - getting caffeine with his soda/chocolate and on Pamelor. He is going to stop all 3. Would hold on further testing other than labs today. If symptoms persist - will then entertain further testing.   2. CAD - remote CABG - no active symptoms  3. HTN - BP better by me and he has good control at home.   4. HLD - on statin therapy  Current medicines are reviewed with the patient today.  The patient does not have concerns regarding medicines other than what has been noted above.  The following changes have been made:  See above.  Labs/ tests ordered today include:    Orders Placed This Encounter  Procedures  . Basic metabolic panel  . CBC  . TSH  . EKG 12-Lead      Disposition:   FU with Dr. Ellyn Hack as planned.    Patient is agreeable to this plan and will call if any problems develop in the interim.   Signed: Burtis Junes, RN, ANP-C 03/22/2015 3:52 PM  [facility] [address] [place], [place]  [address] Phone: [phone] Fax: [phone]

## 2015-03-22 NOTE — Telephone Encounter (Signed)
Returned call to patient.He stated his heart started skipping beats yesterday.Stated he called Dr on call last night and was told he needed to be seen and have EKG. Stated he continues to have skipping today but seems a little better.Appointment scheduled with Dr.Harding 04/07/15 at 3:30 pm.Stated he would like to be seen sooner.Appointment scheduled with Truitt Merle NP this afternoon at 3:00 pm at [facility] office.Advised to take a list of medications and arrive 15 mins early.

## 2015-03-22 NOTE — Telephone Encounter (Signed)
Sean Lucas is calling because on last night his heart was skipping beats and skipping this morning. Please call   Thanks

## 2015-03-22 NOTE — Patient Instructions (Signed)
We will be checking the following labs today - BMET, CBC and TSH   Medication Instructions:    Continue with your current medicines. BUT  STOP Pamelor     Testing/Procedures To Be Arranged:  N/A  Follow-Up:   See Dr. Ellyn Hack as planned.     Other Special Instructions:   Cut the caffeine back in your soda  Cut back the chocolate  Stop Pamelor    If you need a refill on your cardiac medications before your next appointment, please call your pharmacy.   Call the [place] office at [phone] if you have any questions, problems or concerns.

## 2015-03-23 ENCOUNTER — Telehealth: Payer: Self-pay | Admitting: Cardiology

## 2015-03-23 DIAGNOSIS — R002 Palpitations: Secondary | ICD-10-CM

## 2015-03-23 NOTE — Telephone Encounter (Signed)
Returned call to patient he stated he saw Truitt Merle NP 03/22/15 for irregular heart beat.Stated she told him to stop pamelor.Stated he stopped pamelor 4 days ago and he continues to have irregular heart beat.Stated he notices irregular heart beat when he lies down,but when he checks his pulse he feels the irregularity and makes him nervous.Stated he does not drink caffeine or eat chocolate.No decongestants. No chest pain.No sob.Stated he takes Inderal 20 mg twice a day for migraine headaches.Stated he would like to see Dr.Harding before 04/07/15.Advised Dr.Harding's schedule is full.Stated he does not want to see a PA.Advised I will send message to [place] for advice.

## 2015-03-23 NOTE — Telephone Encounter (Signed)
Pt's wife called in stating that the pt is having some problems with her heart skipping. They went to [facility]. and saw Truitt Merle and had an EKG done, she did not state what the results were but he is still having issues and would like for him to be seen sooner than 12/15. Please f/u with her  Thanks

## 2015-03-24 ENCOUNTER — Encounter (INDEPENDENT_AMBULATORY_CARE_PROVIDER_SITE_OTHER): Payer: Medicare HMO

## 2015-03-24 DIAGNOSIS — R002 Palpitations: Secondary | ICD-10-CM | POA: Diagnosis not present

## 2015-03-24 HISTORY — PX: OTHER SURGICAL HISTORY: SHX169

## 2015-03-24 NOTE — Telephone Encounter (Signed)
I am going to want a monitor if I see him - maybe we can get him set up with a monitor - that way I will know what I am dealing with.  [place]

## 2015-03-24 NOTE — Telephone Encounter (Signed)
Returned call to patient.Dr.Harding advised schedule 30 day event monitor.Appointment scheduled to have monitor this afternoon at 4:00 pm.Advised keep appointment with [place] 04/07/15 at 3:30 pm.

## 2015-04-07 ENCOUNTER — Ambulatory Visit: Payer: Medicare HMO | Admitting: Cardiology

## 2015-04-24 HISTORY — PX: OTHER SURGICAL HISTORY: SHX169

## 2015-04-28 ENCOUNTER — Ambulatory Visit (INDEPENDENT_AMBULATORY_CARE_PROVIDER_SITE_OTHER): Payer: Medicare HMO | Admitting: Cardiology

## 2015-04-28 VITALS — BP 140/82 | HR 62 | Ht 69.0 in | Wt 186.1 lb

## 2015-04-28 DIAGNOSIS — E785 Hyperlipidemia, unspecified: Secondary | ICD-10-CM | POA: Diagnosis not present

## 2015-04-28 DIAGNOSIS — I472 Ventricular tachycardia: Secondary | ICD-10-CM

## 2015-04-28 DIAGNOSIS — R002 Palpitations: Secondary | ICD-10-CM | POA: Diagnosis not present

## 2015-04-28 DIAGNOSIS — I2510 Atherosclerotic heart disease of native coronary artery without angina pectoris: Secondary | ICD-10-CM

## 2015-04-28 DIAGNOSIS — Z951 Presence of aortocoronary bypass graft: Secondary | ICD-10-CM | POA: Diagnosis not present

## 2015-04-28 DIAGNOSIS — I10 Essential (primary) hypertension: Secondary | ICD-10-CM

## 2015-04-28 DIAGNOSIS — I4729 Other ventricular tachycardia: Secondary | ICD-10-CM

## 2015-04-28 DIAGNOSIS — I471 Supraventricular tachycardia: Secondary | ICD-10-CM

## 2015-04-28 NOTE — Patient Instructions (Signed)
DO NOT TAKE NORTRIPTYLINE  Your physician has requested that you have a lexiscan myoview. For further information please visit [email or web address]. Please follow instruction sheet, as given.   WILL CALL LET YOU KNOW RESULTS   Your physician wants you to follow-up in 6 MONTHS DR HARDING. You will receive a reminder letter in the mail two months in advance. If you don't receive a letter, please call our office to schedule the follow-up appointment.  If you need a refill on your cardiac medications before your next appointment, please call your pharmacy.

## 2015-04-28 NOTE — Progress Notes (Signed)
PCP: Antonietta Jewel, MD  Clinic Note: Chief Complaint  Patient presents with  . Follow-up    30 day event monitor/Palpitations--took off Sunday night--saw Remer Macho, NP 03/22/15.  . Dizziness    occasional, random, possibly when BP is elevated.  . Coronary Artery Disease    HPI: Sean Lucas is a 75 y.o. male with a PMH below who presents today for follow-up visit for palpitations. Has worn event monitor.. He has a history of CAD with prior CABG in 1996 with LIMA to LAD, SVG to OM, SVG to RCA per EBG, HTN, palpitations & HLD. He had a basically normal event monitor back in August of 2014.  I last saw him in Nov 2015.  CIRE CAUTHON was last seen on Mar 22, 2015 by Truitt Merle with c/o Palpitations -- wore monitor x 1 month -- Has had onset of palpitations - started Sunday night. Feels more if turning over in the bed. Could hear his heart beating in his ears. Noted his heart skipping. No coffee. Does like chocolate. BP is up here today. Last night 150/80. Recently started on Pamelor - this is for chronic pain. Wife also bought his diet soda that was not decaff. No chest/jaw pain. Remains active. No passing out.   He was told to stop taking nortriptyline. He has not taken it since being seen by Truitt Merle, PA. Notably since stopping it he has not had as much palpitations.  Recent Hospitalizations: none  Studies Reviewed:   MCOT Monitor.  NSR & rare S Tachy, occasional PVCs, 1 run 7 beats slow NSVT, 1 run ~16 sec SVT. - no Sx noted.    AAA Duplex April 2016- normal, R Renal Cyst - to see PCP  Interval History: Tyjohn presents today actually saying that he is not had any more of those rapid heartbeat episodes that he can tell since wearing a monitor. He doesn't really notice that his had any actually while wearing the monitor. EKG does state that when he is feeling no symptoms he fell over discomfort in his chest, but has not had any resting or exertional chest discomfort or  dyspnea since. No sick or near-syncope. No heart failure symptoms of PND, orthopnea or edema.  He has had some intermittent episodes of chest discomfort however. Sometimes exertional, sometimes not. It is similar in a way to his anginal symptoms, but not completely. Not very long lasting, and seems to be more lateral. He does note occasional random dizziness spells -can be associated with elevated headache, no blurred vision No weakness or syncope/near syncope. No TIA/amaurosis fugax symptoms. No claudication.  ROS: A comprehensive was performed. Review of Systems  Constitutional: Negative for malaise/fatigue.  HENT: Negative for nosebleeds.   Respiratory: Negative for cough and shortness of breath.   Cardiovascular: Positive for chest pain.       See history of present illness. No further palpitations.  Gastrointestinal: Negative for blood in stool and melena.  Genitourinary: Negative for hematuria.  Musculoskeletal: Negative for myalgias and joint pain.  Neurological: Positive for dizziness and headaches.  Endo/Heme/Allergies: Does not bruise/bleed easily.  All other systems reviewed and are negative.   Past Medical History  Diagnosis Date  . Aortic aneurysm ([place])     By patient report  . CAD S/P percutaneous coronary angioplasty 1988    RCA PTCA in 1998 -- angina symptom = jaw and tooth pain  . 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 CAD in native artery     Three-vessel disease, normal wall motion normal ejection fraction.  . Dyslipidemia, goal LDL below 70   . Hypertension   . Chronic back pain   . History of palpitations     Related to anxiety, much improved -- true etiology not established (No notabl eabnormalities on CardioNet - 12/2012)  . Statin intolerance   . Post-traumatic arthritis of lower leg      left lower leg suffered significant trauma with multiple surgeries to reconstruct the foot. This gives him chronic foot  and lower leg pain. He walks with a limp.     Past Surgical History  Procedure Laterality Date  . Nm myoview ltd  March 2010    Small sized, mild intensity perfusion defect in the mid anterior wall, no reversibility. Either prior infarct or artifact. No evidence of ischemia  . Nm lexiscan myoview ltd  07/22/2012    Normal homogenous uptake, no ischemia or infarction. Previously noted anterior "thinning "not seen; EF 61%  . Coronary artery bypass graft  07/05/1994    LIMA-LAD, SVG-OM, SVG-dRCA  . Coronary angioplasty  1988    RCA  . Cardiac catheterization  07/04/1994    3-vessel disease normal wall motion and ejecton.--CABG x 3  . Cardionet event monitor  8-9 2014    Mostly NSR, occasional sinus bradycardia. No significant PACs, PVCs or arrhythmias.    Prior to Admission medications   Medication Sig Start Date End Date Taking? Authorizing Provider  aspirin 325 MG tablet Take 325 mg by mouth daily.   Yes Historical Provider, MD  Cholecalciferol (VITAMIN D-3) 1000 UNITS CAPS Take 1,000 Units by mouth daily.   Yes Historical Provider, MD  diazepam (VALIUM) 5 MG tablet Take 5 mg by mouth 3 (three) times daily.   Yes Historical Provider, MD  fish oil-omega-3 fatty acids 1000 MG capsule Take 2 g by mouth daily.   Yes Historical Provider, MD  Flaxseed, Linseed, (FLAX SEED OIL PO) Take by mouth.   Yes Historical Provider, MD  Glucosamine HCl (GLUCOSAMINE PO) Take 1,500 mg by mouth daily.   Yes Historical Provider, MD  lisinopril (PRINIVIL,ZESTRIL) 40 MG tablet Take 1 tablet (40 mg total) by mouth daily. 04/13/13  Yes Leonie Man, MD  meloxicam (MOBIC) 7.5 MG tablet Take 7.5 mg by mouth daily.   Yes Historical Provider, MD  Multiple Vitamins-Minerals (MULTIVITAMIN WITH MINERALS) tablet Take 1 tablet by mouth daily.   Yes Historical Provider, MD  nitroGLYCERIN (NITROSTAT) 0.4 MG SL tablet Place 0.4 mg under the tongue every 5 (five) minutes x 2 doses as needed for chest pain.   Yes Historical  Provider, MD  omeprazole (PRILOSEC) 20 MG capsule Take 20 mg by mouth daily.   Yes Historical Provider, MD  Oxycodone HCl 10 MG TABS Take 15 mg by mouth 2 (two) times daily.  11/20/12  Yes Historical Provider, MD  propranolol (INDERAL) 20 MG tablet Take 20 mg by mouth 2 (two) times daily.    Yes Historical Provider, MD  psyllium (REGULOID) 0.52 G capsule Take 0.52 g by mouth daily.   Yes Historical Provider, MD  rosuvastatin (CRESTOR) 20 MG tablet Take 1 tablet (20 mg total) by mouth daily. 03/30/13  Yes Leonie Man, MD  Sennosides 15 MG TABS  01/12/14  Yes Historical Provider, MD  venlafaxine (EFFEXOR) 75 MG tablet Take 75 mg by mouth daily.   Yes Historical Provider, MD   Allergies  Allergen  Reactions  . Lipitor [Atorvastatin] Other (See Comments)    myalgias  . Simvastatin Other (See Comments)    myalgias     Social History   Social History  . Marital Status: Married    Spouse Name: N/A  . Number of Children: N/A  . Years of Education: N/A   Social History Main Topics  . Smoking status: Never Smoker   . Smokeless tobacco: None  . Alcohol Use: No  . Drug Use: No  . Sexual Activity: Not Asked   Other Topics Concern  . None   Social History Narrative   He is a married father of 29, grandfather to 50, great-grandfather of 4. He has a lot of difficulty with his feet. Walking on a treadmill really bothers his feet but he does try to be active. He denies any tobacco or alcohol use.    Family History  Problem Relation Age of Onset  . Leukemia Mother   . Cancer - Lung Father   . Leukemia Sister     Wt Readings from Last 3 Encounters:  04/28/15 186 lb 1.6 oz (84.414 kg)  03/22/15 183 lb 1.9 oz (83.063 kg)  03/22/14 193 lb 9.6 oz (87.816 kg)    PHYSICAL EXAM BP 140/82 mmHg  Pulse 62  Ht 5\' 9"  (1.753 m)  Wt 186 lb 1.6 oz (84.414 kg)  BMI 27.47 kg/m2 General appearance: alert, cooperative, appears stated age, no distress and borderline obese HEENT: [place]/AT, EOMI, MMM,  anicteric sclera Neck: no adenopathy, no carotid bruit and no JVD Lungs: clear to auscultation bilaterally, normal percussion bilaterally and non-labored Heart: RRR. Normal S1, S2. No M/R/G. Nondisplaced PMI.  Abdomen: soft, non-tender; bowel sounds normal; no masses, no organomegaly; notable diastasis recti or possible incisional hernia from his previous surgeries. Extremities: extremities normal, atraumatic, no cyanosis, and edema; he wears thick workboots to support his left ankle. Walks with antalgic gait favoring the left foot Pulses: 2+ and symmetric; Neurologic: Mental status: Alert, oriented, thought content appropriate; Cranial nerves: normal (II-XII grossly intact)    Adult ECG Report Not checked  Other studies Reviewed: Additional studies/ records that were reviewed today include:  Recent Labs:  No labs available. Checked by PCP.    ASSESSMENT / PLAN: Problem List Items Addressed This Visit    SVT (supraventricular tachycardia) ([place])    1 short episode. Probably barely in upper and be symptomatic with it. He could however have been having longer episodes prior to this. He is on beta blocker. We discussed vagal maneuvers.      Relevant Orders   Myocardial Perfusion Imaging   S/P CABG x 3 - Primary (Chronic)   Relevant Orders   Myocardial Perfusion Imaging   NSVT (nonsustained ventricular tachycardia) ([place])    Short run, very likely could be related to baseline cardiac structure, however with more common PVCs that are symptomatic and intermittent chest discomfort, will proceed with Lexiscan Myoview.      Relevant Orders   Myocardial Perfusion Imaging   MV CAD - s/p CABG (Chronic)    Is not really having classic anginal symptoms, but has had some chest discomfort and dyspnea. I'm a bit concerned about questionable VT on his event monitor. Albeit only a few beats, he has had a lot more PVCs that are now symptomatic. These are some of the prodromal symptoms he had back of  the time of his initial event and 89 as he recalls.  Plan: Continue current medications with beta blocker, ACE inhibitor,  aspirin and statin.  Need to exclude ischemic etiology for PVCs and short run of nonsustained VT. -- Lexiscan Myoview      Heart palpitations    Probably what is feeling is the PVCs. He did have one episode of SVT. We talked about treatment options. Really don't have a lot more room to go with increasing propranolol, based upon resting heart rate of 62.      Relevant Orders   Myocardial Perfusion Imaging   Essential hypertension (Chronic)    Again borderline pressures today. He had low blood pressures initially. Now back with lisinopril to 40 mg daily. For now would continue to monitor, but would consider calcium channel blocker for additional blood pressure control based on history of CAD and arrhythmias now.      Dyslipidemia, goal LDL below 70 (Chronic)    His PCP follows his labs. Due to have been checked here in April. Continues to be on Crestor without myalgias in combination with flaxseed oil.      Relevant Orders   Myocardial Perfusion Imaging      Current medicines are reviewed at length with the patient today. (+/- concerns) none The following changes have been made: None Studies Ordered:   Orders Placed This Encounter  Procedures  . Myocardial Perfusion Imaging      Leonie Man, M.D., M.S. Interventional Cardiologist   Pager # [phone]

## 2015-04-29 ENCOUNTER — Telehealth (HOSPITAL_COMMUNITY): Payer: Self-pay

## 2015-04-29 NOTE — Telephone Encounter (Signed)
Encounter complete. 

## 2015-04-30 ENCOUNTER — Encounter: Payer: Self-pay | Admitting: Cardiology

## 2015-04-30 DIAGNOSIS — I471 Supraventricular tachycardia, unspecified: Secondary | ICD-10-CM | POA: Insufficient documentation

## 2015-04-30 DIAGNOSIS — I4729 Other ventricular tachycardia: Secondary | ICD-10-CM | POA: Insufficient documentation

## 2015-04-30 DIAGNOSIS — I472 Ventricular tachycardia: Secondary | ICD-10-CM | POA: Insufficient documentation

## 2015-04-30 NOTE — Assessment & Plan Note (Signed)
Short run, very likely could be related to baseline cardiac structure, however with more common PVCs that are symptomatic and intermittent chest discomfort, will proceed with Lexiscan Myoview.

## 2015-04-30 NOTE — Assessment & Plan Note (Signed)
Is not really having classic anginal symptoms, but has had some chest discomfort and dyspnea. I'm a bit concerned about questionable VT on his event monitor. Albeit only a few beats, he has had a lot more PVCs that are now symptomatic. These are some of the prodromal symptoms he had back of the time of his initial event and 23 as he recalls.  Plan: Continue current medications with beta blocker, ACE inhibitor, aspirin and statin.  Need to exclude ischemic etiology for PVCs and short run of nonsustained VT. -- [employer]

## 2015-04-30 NOTE — Assessment & Plan Note (Signed)
His PCP follows his labs. Due to have been checked here in April. Continues to be on Crestor without myalgias in combination with flaxseed oil.

## 2015-04-30 NOTE — Assessment & Plan Note (Signed)
Probably what is feeling is the PVCs. He did have one episode of SVT. We talked about treatment options. Really don't have a lot more room to go with increasing propranolol, based upon resting heart rate of 62.

## 2015-04-30 NOTE — Assessment & Plan Note (Signed)
1 short episode. Probably barely in upper and be symptomatic with it. He could however have been having longer episodes prior to this. He is on beta blocker. We discussed vagal maneuvers.

## 2015-04-30 NOTE — Assessment & Plan Note (Signed)
Again borderline pressures today. He had low blood pressures initially. Now back with lisinopril to 40 mg daily. For now would continue to monitor, but would consider calcium channel blocker for additional blood pressure control based on history of CAD and arrhythmias now.

## 2015-05-02 ENCOUNTER — Telehealth: Payer: Self-pay | Admitting: Cardiology

## 2015-05-02 NOTE — Telephone Encounter (Signed)
Pt still having short runs of palps/PVCs. These are brief and self-limiting. He has been instructed by Dr. Ellyn Hack on what to do and when to seek emergency care, other recommended interventions.  Pt has stress test on Wednesday to investigate on this further - aware Dr. Ellyn Hack wanted to do stress test d/t palps.  Pt will call if new concerns.

## 2015-05-02 NOTE — Telephone Encounter (Signed)
Sean Lucas is calling because his heart is skipping a beat and is worried about this because he has a stress test on tomorrow . Please call   Thanks

## 2015-05-03 ENCOUNTER — Telehealth (HOSPITAL_COMMUNITY): Payer: Self-pay

## 2015-05-03 NOTE — Telephone Encounter (Signed)
Encounter complete. 

## 2015-05-04 ENCOUNTER — Ambulatory Visit (HOSPITAL_COMMUNITY)
Admission: RE | Admit: 2015-05-04 | Discharge: 2015-05-04 | Disposition: A | Discharge status: Home / Self Care | Payer: Medicare HMO | Source: Ambulatory Visit | Attending: Internal Medicine | Admitting: Internal Medicine

## 2015-05-04 DIAGNOSIS — I471 Supraventricular tachycardia: Secondary | ICD-10-CM

## 2015-05-04 DIAGNOSIS — I472 Ventricular tachycardia: Secondary | ICD-10-CM | POA: Diagnosis not present

## 2015-05-04 DIAGNOSIS — I10 Essential (primary) hypertension: Secondary | ICD-10-CM | POA: Diagnosis not present

## 2015-05-04 DIAGNOSIS — I4729 Other ventricular tachycardia: Secondary | ICD-10-CM

## 2015-05-04 DIAGNOSIS — Z8249 Family history of ischemic heart disease and other diseases of the circulatory system: Secondary | ICD-10-CM | POA: Diagnosis not present

## 2015-05-04 DIAGNOSIS — R42 Dizziness and giddiness: Secondary | ICD-10-CM | POA: Insufficient documentation

## 2015-05-04 DIAGNOSIS — Z951 Presence of aortocoronary bypass graft: Secondary | ICD-10-CM | POA: Insufficient documentation

## 2015-05-04 DIAGNOSIS — R002 Palpitations: Secondary | ICD-10-CM | POA: Diagnosis not present

## 2015-05-04 DIAGNOSIS — E785 Hyperlipidemia, unspecified: Secondary | ICD-10-CM | POA: Diagnosis not present

## 2015-05-04 LAB — MYOCARDIAL PERFUSION IMAGING
CHL CUP NUCLEAR SRS: 3
LV sys vol: 40 mL
LVDIAVOL: 84 mL
NUC STRESS TID: 1.04
Peak HR: 81 {beats}/min
Rest HR: 65 {beats}/min
SDS: 1
SSS: 4

## 2015-05-04 MED ORDER — TECHNETIUM TC 99M SESTAMIBI GENERIC - CARDIOLITE
10.3000 | Freq: Once | INTRAVENOUS | Status: AC | PRN
  Administered 2015-05-04: 10.3 via INTRAVENOUS

## 2015-05-04 MED ORDER — TECHNETIUM TC 99M SESTAMIBI GENERIC - CARDIOLITE
32.1000 | Freq: Once | INTRAVENOUS | Status: AC | PRN
  Administered 2015-05-04: 32.1 via INTRAVENOUS

## 2015-05-04 MED ORDER — REGADENOSON 0.4 MG/5ML IV SOLN
0.4000 mg | Freq: Once | INTRAVENOUS | Status: AC
  Administered 2015-05-04: 0.4 mg via INTRAVENOUS

## 2015-05-05 ENCOUNTER — Telehealth: Payer: Self-pay | Admitting: *Deleted

## 2015-05-05 ENCOUNTER — Telehealth: Payer: Self-pay | Admitting: Cardiology

## 2015-05-05 NOTE — Progress Notes (Signed)
Quick Note:  Stress Test looked good!! No sign of significant Heart Artery Disease Blockages. Pump function is " low normal".  Good news!!.  Leonie Man, MD  Pls forward to PCP: The PCP user Antonietta Jewel, MD  ______

## 2015-05-05 NOTE — Telephone Encounter (Signed)
Sean Lucas calling because he complains that he is feeling a skipped beat more frequently Reviewed the results of his event monitor and his perfusion test yesterday Was told that he did not need to see Dr. Ellyn Hack for 6 months and wants to see him sooner Explained to him the the skipped beats were. Patient is concerned about the skipped beats and what Dr. Ellyn Hack is going to do about them.

## 2015-05-05 NOTE — Telephone Encounter (Signed)
Spoke to patient. Result given . Verbalized understanding  

## 2015-05-05 NOTE — Telephone Encounter (Signed)
-----   Message from Leonie Man, MD sent at 05/05/2015  6:15 AM EST ----- Stress Test looked good!! No sign of significant Heart Artery Disease Blockages.  Pump function is " low normal".  Good news!!.  Leonie Man, MD  Pls forward to PCP: The PCP user Antonietta Jewel, MD

## 2015-05-05 NOTE — Telephone Encounter (Signed)
Sean Lucas is calling because he was in on yesterday for a stress test and was told that it was fine , but hi heart is still skipping a beat . Stating that out of 30 beats his heart skip between 3 to 4 times . Please call   Thanks

## 2015-05-06 NOTE — Telephone Encounter (Signed)
Please call,was waiting to hear from you.

## 2015-05-06 NOTE — Telephone Encounter (Signed)
Patient would like to know if he could be referred to the cardiac electrophysiologist as Dr Ellyn Hack was planning on doing instead of coming back into the office to discuss with him the plan.  Feels it would save him time and money

## 2015-05-09 ENCOUNTER — Telehealth: Payer: Self-pay

## 2015-05-09 NOTE — Telephone Encounter (Signed)
Explained to patient that DR. Ellyn Hack does not feel at this point based on his monitor he doesn't need to see EP Explained to patient that Dr. Ellyn Hack would like to increase his dose of propanolol Patient repeated instructions given to him correctly: Patient will increase propanolol to 40 mg every morning and 20 mg every evening for 7 days taking his heart rate before each dose.  If his heart rate is >50 he will take propanolol; if < 50 he will hold and call the office On the second week he will increase his propanolol to 40 mg in the morning and 40 mg in the evening taking his HR before each dose.  If his HR is >50 he will take propanolol; if <50 he will hold and call the office He will report back if it has helped.  Also told him to notify the office if he experiences dizziness, weakness, or feels faint. Patient also wished to cancel appointment with Dr. Ellyn Hack to save money on co-pay OV expense.  Copied and paster from staff message:  I am fine with referring to EP.  That was not my intention unless he was having prolonged SVT with Syncope -- i.e. If we were planning on SVT ablation.  He is not at that stage based on his monitor.   I was worried about the short run of slow VT - but with normal Nuc - I am not as concerned.  He is on a Beta Blocker - Propranolol 20 mg bid & with resting HR of 62, I do not have lots of room to move with this -- nor will EP.   We can increase his Propranolol to 40 mg BID & see how this works.  Start by increasing one of the dose times to 40 mg (pick the time of day during which he notes the most palpitations first). Take 40 mg & 20 mg (i.e. 40 mg PM & 20 mg AM) x 1 week, then increase to 40 mg BID. We will need to change his Rx accordingly - he can use the 20 mg tabs until done & start with new Rx for 40 mg BID.   Would need to keep track of his HR with each dose change - OK for HR > 50 bpm.

## 2015-05-11 ENCOUNTER — Ambulatory Visit: Payer: Medicare HMO | Admitting: Cardiology

## 2015-05-12 ENCOUNTER — Telehealth: Payer: Self-pay | Admitting: Cardiology

## 2015-05-12 NOTE — Telephone Encounter (Signed)
Patient is at his PCP's getting seen for rash and hives Patient said that the rash started after increasing his propanolol Patient has been on propanolol for 40+ years he states. He thinks it is highly unlikely that he would develop hives just by increasing does He did get a new shipment of propanolol that are shaped different.  Patient said that he had an allergic reaction with hives years ago when he used a new laundry detergent Patient has recently changed laundry detergent I said that he needs to discuss with his PCP the change in his detergents as a possible cause of the reaction.  I instructed him to call us with any updates The hives started last night and he had taken Benadryl with some improvement  Routed to [facility] and Dr. Ellyn Hack

## 2015-05-12 NOTE — Telephone Encounter (Signed)
Pt called in stating that since his dosage for Propanolol has been doubled he has broken out in a rash. He would like to be advised on what to do. Please f/u with him as soon as possible.   Thanks

## 2015-05-12 NOTE — Telephone Encounter (Signed)
Returning a call .. Please call  ° °Thanks  °

## 2015-05-12 NOTE — Telephone Encounter (Signed)
Doubt that it is related to changing Propranolol dose.  Leonie Man, MD

## 2015-05-12 NOTE — Telephone Encounter (Signed)
Left voice message on home phone to call back No voice mail set up on mobile phone

## 2015-05-26 NOTE — Telephone Encounter (Signed)
Opened in error

## 2015-09-23 ENCOUNTER — Telehealth: Payer: Self-pay | Admitting: Cardiology

## 2015-09-23 NOTE — Telephone Encounter (Signed)
Received records from [facility] for appointment on 10/11/15 with Dr Ellyn Hack.  Records given to [facility] (medical records) for Dr Allison Quarry schedule on 10/11/15. lp

## 2015-10-11 ENCOUNTER — Encounter: Payer: Self-pay | Admitting: Cardiology

## 2015-10-11 ENCOUNTER — Ambulatory Visit (INDEPENDENT_AMBULATORY_CARE_PROVIDER_SITE_OTHER): Payer: Medicare HMO | Admitting: Cardiology

## 2015-10-11 VITALS — BP 120/65 | HR 54 | Ht 69.0 in | Wt 185.0 lb

## 2015-10-11 DIAGNOSIS — I714 Abdominal aortic aneurysm, without rupture, unspecified: Secondary | ICD-10-CM

## 2015-10-11 DIAGNOSIS — I10 Essential (primary) hypertension: Secondary | ICD-10-CM

## 2015-10-11 DIAGNOSIS — R002 Palpitations: Secondary | ICD-10-CM

## 2015-10-11 DIAGNOSIS — I2510 Atherosclerotic heart disease of native coronary artery without angina pectoris: Secondary | ICD-10-CM

## 2015-10-11 DIAGNOSIS — E785 Hyperlipidemia, unspecified: Secondary | ICD-10-CM | POA: Diagnosis not present

## 2015-10-11 DIAGNOSIS — I471 Supraventricular tachycardia, unspecified: Secondary | ICD-10-CM

## 2015-10-11 DIAGNOSIS — I472 Ventricular tachycardia: Secondary | ICD-10-CM | POA: Diagnosis not present

## 2015-10-11 DIAGNOSIS — I4729 Other ventricular tachycardia: Secondary | ICD-10-CM

## 2015-10-11 DIAGNOSIS — Z951 Presence of aortocoronary bypass graft: Secondary | ICD-10-CM

## 2015-10-11 NOTE — Patient Instructions (Signed)
No changes with current medications  Follow instruction to stop fast heart rate ( vagal maneuvers)- like  Coughing, or bearing down, if reoccur may use metoprolol tartrate as needed  Your physician wants you to follow-up in:6 months with DR HARDING.You will receive a reminder letter in the mail two months in advance. If you don't receive a letter, please call our office to schedule the follow-up appointment.   If you need a refill on your cardiac medications before your next appointment, please call your pharmacy.

## 2015-10-11 NOTE — Progress Notes (Signed)
PCP: Antonietta Jewel, [facility] Note: Chief Complaint  Patient presents with  . Coronary Artery Disease    follow-up  . Palpitations    HPI: Sean Lucas is a 75 y.o. male with a PMH below who presents today for Six-month follow-up of CAD.  He has a history of CAD with prior CABG in 1996 with LIMA to LAD, SVG to OM, SVG to RCA per EBG, HTN, palpitations & HLD. He had a basically normal event monitor back in August of 2014.   MONTERO MONCE was last seen on May 22 by Dr. Sheryle Hail follow-up for fatigue. Apparently noted that current medications were helpful for pain and anxiety as well as sleep. He was referred to cardiology for as best I can tell - chest pain.  I saw him in January probably to follow-up palpitations.  Recent Hospitalizations: none  Studies Reviewed:  Myoview Jan 2017:  Study Highlights     Nuclear stress EF: 53%.  The left ventricular ejection fraction is mildly decreased (45-54%).  There was no ST segment deviation noted during stress.  The study is normal.  This is a low risk study.  Mild hypokinesis of the apical septum   Study Highlights - Event Monitor Dec 2017    Mostly NSR with rare Sinus Tachycardia  Occasional PVCs  1 - 7 beat run of slow ? VT (rate ~110)  1 - 16 Sec run of SVT    Interval History: Sean Lucas actually presents today feeling better now he is not having any further chest pain symptoms. No further rapid heartbeat spells no syncope or near-syncope symptoms. He is trying to get himself back into routine activity. He says that sometimes the chest pain happens at least has some stress. He basically says he can't sleep at night if he has somebody the next day, but otherwise he is doing okay from that perspective. Since starting on the Inderal twice a day, he'll note is less palpitations. He still worried about the palpitations that he had before. He is not noticing as much now however. He just is  concerned with the fact that his grandson had an episode of SVT and underwent ablation. I explained to him that short bursts of PAT/PSVT are very different than symptomatic SVT that leads to near syncope and syncope. At this point I think medical management and observation is the best option especially if he is not having more symptoms. No routine chest pain or shortness breath with rest or exertion. He is somewhat limited activity by arthritis pains and will get short of breath if he walks a long ways. No PND, orthopnea but he does have some little little swelling in the left greater than right leg. This is usually when he is not active. When he walks around the swelling usually is better. No syncope/near syncope or TIAs amaurosis fugax symptoms.  No claudication. No leg cramping or aching or myalgias.  He tells me is try to be active and is not really noticing exertional dyspnea with doing his routine yardwork. He walks on a treadmill and does bush whacking/weed eating and is feeling overall better now.  ROS: A comprehensive was performed. Review of Systems  Constitutional: Negative for malaise/fatigue.  HENT: Negative for nosebleeds.   Eyes: Negative for blurred vision.  Respiratory: Negative for cough, shortness of breath and wheezing.   Cardiovascular: Negative.  Negative for chest pain and palpitations.  Gastrointestinal: Negative for blood in stool and melena.  Genitourinary: Negative for hematuria.  Musculoskeletal: Positive for joint pain (Some hand and knee arthritis). Negative for myalgias and falls.  Neurological: Negative for dizziness and loss of consciousness.  Endo/Heme/Allergies: Does not bruise/bleed easily.  Psychiatric/Behavioral: Negative for depression and memory loss. The patient has insomnia (Only if he has a pressing issue the next day). The patient is not nervous/anxious.   All other systems reviewed and are negative.   Past Medical History  Diagnosis Date  .  Aortic aneurysm ([place])     By patient report  . CAD S/P percutaneous coronary angioplasty 1988    RCA PTCA in 1998 -- angina symptom = jaw and tooth pain  . 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 CAD in native artery     Three-vessel disease, normal wall motion normal ejection fraction.  . Dyslipidemia, goal LDL below 70   . Hypertension   . Chronic back pain   . History of palpitations     Related to anxiety, much improved -- true etiology not established (No notabl eabnormalities on CardioNet - 12/2012)  . Statin intolerance   . Post-traumatic arthritis of lower leg      left lower leg suffered significant trauma with multiple surgeries to reconstruct the foot. This gives him chronic foot and lower leg pain. He walks with a limp.     Past Surgical History  Procedure Laterality Date  . Nm myoview ltd  March 2010    Small sized, mild intensity perfusion defect in the mid anterior wall, no reversibility. Either prior infarct or artifact. No evidence of ischemia  . Nm lexiscan myoview ltd  07/22/2012    Normal homogenous uptake, no ischemia or infarction. Previously noted anterior "thinning "not seen; EF 61%  . Coronary artery bypass graft  07/05/1994    LIMA-LAD, SVG-OM, SVG-dRCA  . Coronary angioplasty  1988    RCA  . Cardiac catheterization  07/04/1994    3-vessel disease normal wall motion and ejecton.--CABG x 3  . Cardionet event monitor  8-9 2014    Mostly NSR, occasional sinus bradycardia. No significant PACs, PVCs or arrhythmias.   Prior to Admission medications   Medication Sig Start Date Taking? Authorizing Provider  aspirin 325 MG tablet Take 325 mg by mouth daily.  Yes Historical Provider, MD  Cholecalciferol (VITAMIN D-3) 1000 UNITS CAPS Take 1,000 Units by mouth daily.  Yes Historical Provider, MD  diazepam (VALIUM) 5 MG tablet Take 5 mg by mouth 3 (three) times daily.  Yes Historical Provider, MD  fish oil-omega-3 fatty  acids 1000 MG capsule Take 2 g by mouth daily.  Yes Historical Provider, MD  Flaxseed, Linseed, (FLAX SEED OIL PO) Take by mouth.  Yes Historical Provider, MD  Glucosamine HCl (GLUCOSAMINE PO) Take 1,500 mg by mouth daily.  Yes Historical Provider, MD  lisinopril (PRINIVIL,ZESTRIL) 40 MG tablet Take 1 tablet (40 mg total) by mouth daily. 04/13/13 Yes Leonie Man, MD  meloxicam (MOBIC) 7.5 MG tablet Take 7.5 mg by mouth daily.  Yes Historical Provider, MD  Multiple Vitamins-Minerals (MULTIVITAMIN WITH MINERALS) tablet Take 1 tablet by mouth daily.  Yes Historical Provider, MD  nitroGLYCERIN (NITROSTAT) 0.4 MG SL tablet Place 0.4 mg under the tongue every 5 (five) minutes x 2 doses as needed for chest pain.  Yes Historical Provider, MD  omeprazole (PRILOSEC) 20 MG capsule Take 20 mg by mouth daily.  Yes Historical Provider, MD  Oxycodone HCl  10 MG TABS Take 15 mg by mouth 2 (two) times daily.  11/20/12 Yes Historical Provider, MD  propranolol (INDERAL) 20 MG tablet Take 20 mg by mouth 2 (two) times daily.   Yes Historical Provider, MD  psyllium (REGULOID) 0.52 G capsule Take 0.52 g by mouth daily.  Yes Historical Provider, MD  rosuvastatin (CRESTOR) 20 MG tablet Take 1 tablet (20 mg total) by mouth daily. 03/30/13 Yes Leonie Man, MD  Sennosides 15 MG TABS  01/12/14 Yes Historical Provider, MD  venlafaxine (EFFEXOR) 75 MG tablet Take 75 mg by mouth daily.  Yes Historical Provider, MD  * Clonidine 0.1 mg tid   Allergies  Allergen Reactions  . Lipitor [Atorvastatin] Other (See Comments)    myalgias  . Simvastatin Other (See Comments)    myalgias    Social History   Social History  . Marital Status: Married    Spouse Name: N/A  . Number of Children: N/A  . Years of Education: N/A   Social History Main Topics  . Smoking status: Never Smoker   . Smokeless tobacco: None  . Alcohol Use: No  . Drug Use: No  . Sexual Activity: Not Asked   Other Topics Concern  . None   Social  History Narrative   He is a married father of 82, grandfather to 85, great-grandfather of 4. He has a lot of difficulty with his feet. Walking on a treadmill really bothers his feet but he does try to be active. He denies any tobacco or alcohol use.     Family History  Problem Relation Age of Onset  . Leukemia Mother   . Cancer - Lung Father   . Leukemia Sister     Wt Readings from Last 3 Encounters:  10/11/15 185 lb (83.915 kg)  05/04/15 186 lb (84.369 kg)  04/28/15 186 lb 1.6 oz (84.414 kg)    PHYSICAL EXAM BP 120/65 mmHg  Pulse 54  Ht 5\' 9"  (1.753 m)  Wt 185 lb (83.915 kg)  BMI 27.31 kg/m2 General appearance: alert, cooperative, appears stated age, no distress and borderline obese HEENT: [place]/AT, EOMI, MMM, anicteric sclera Neck: no adenopathy, no carotid bruit and no JVD Lungs: clear to auscultation bilaterally, normal percussion bilaterally and non-labored Heart: RRR. Normal S1, S2. No M/R/G. Nondisplaced PMI.  Abdomen: soft, non-tender; bowel sounds normal; no masses, no organomegaly; notable diastasis recti or possible incisional hernia from his previous surgeries. Extremities: extremities normal, atraumatic, no cyanosis, and edema; he wears thick workboots to support his left ankle. Walks with antalgic gait favoring the left foot Pulses: 2+ and symmetric; Neurologic: Mental status: Alert, oriented, thought content appropriate; Cranial nerves: normal (II-XII grossly intact)   Adult ECG Report  Rate: 54 ;  Rhythm: sinus bradycardia and T-wave abnormality, consider inferior ischemia. Borderline first-degree AV block. Also lead 3 is now upright T-wave inversion.;   Narrative Interpretation: Otherwise stable EKG.   Other studies Reviewed: Additional studies/ records that were reviewed today include:  Recent Labs:  Labs are followed by PCP Lab Results  Component Value Date   CHOL 159 07/27/2013   HDL 57 07/27/2013   LDLCALC 69 07/27/2013   TRIG 167* 07/27/2013    CHOLHDL 2.8 07/27/2013     ASSESSMENT / PLAN: Problem List Items Addressed This Visit    SVT (supraventricular tachycardia) ([place])    Again he had one short episode on his monitor. Overall symptoms are better controlled on increased dose of Inderal, cannot titrate further. Discussed vagal maneuvers  for breaking prolonged SVT spells. If he does have more symptoms, we can talk about using it when necessary short-acting beta blocker.      Relevant Orders   EKG 12-Lead   S/P CABG x 3 (Chronic)    Thankfully, Myoview continues to be negative with 75 year old grafts. At this point since he is just had a Myoview, and would not need to recheck one for a few more years may be 4-5 years from now.      Relevant Orders   EKG 12-Lead   NSVT (nonsustained ventricular tachycardia) ([place]) - Primary    Very short run. May be related to small prior infarct, no major infarct noted on Myoview and no ischemia noted. On stable dose of beta blocker, but cannot titrate up further because of baseline bradycardia.      Relevant Orders   EKG 12-Lead   MV CAD - s/p CABG (Chronic)    Back active now doing plenty of exercise without any chest pain or exertional dyspnea. Recent Myoview in February was negative / low risk. Normal EF. He is on aspirin, statin, ACE inhibitor and beta blocker.       Heart palpitations    Most likely related to PVCs. I don't know that he would've noticed the very short burst of PSVT or VT. Those probably were not any longer and noticeable than just simple PVCs. Should be a fine on beta blockers especially with negative ischemic evaluation. Cannot further titrate beta blocker because of bradycardia.      Essential hypertension (Chronic)    Well-controlled today on his baseline dose of ACE inhibitor along with propranolol.      Relevant Orders   EKG 12-Lead   Dyslipidemia, goal LDL below 70 (Chronic)    Labs followed by PCP. Tolerating Crestor.      Relevant Orders   EKG  12-Lead   Aortic aneurysm - by report (Chronic)    ABDOMINAL ULTRASOUND results: No evidence of abdominal aortic aneurysm on abdominal Dopplers. Discontinue removed from his history.          Current medicines are reviewed at length with the patient today. (+/- concerns) Once to make sure things are okay with his palpitations The following changes have been made:  Follow instruction to stop fast heart rate ( vagal maneuvers)- like  Coughing, or bearing down, if reoccur may use metoprolol tartrate as needed  Your physician wants you to follow-up in:6 months with DR HARDING.  Studies Ordered:   Orders Placed This Encounter  Procedures  . EKG 12-Lead      Glenetta Hew, M.D., M.S. Interventional Cardiologist   Pager # [phone] Phone # [phone] [address] [place], [place] [address]

## 2015-10-12 ENCOUNTER — Encounter: Payer: Self-pay | Admitting: Cardiology

## 2015-10-12 NOTE — Assessment & Plan Note (Signed)
ABDOMINAL ULTRASOUND results: No evidence of abdominal aortic aneurysm on abdominal Dopplers. Discontinue removed from his history.

## 2015-10-12 NOTE — Assessment & Plan Note (Signed)
Thankfully, Myoview continues to be negative with 75 year old grafts. At this point since he is just had a Myoview, and would not need to recheck one for a few more years may be 4-5 years from now.

## 2015-10-12 NOTE — Assessment & Plan Note (Signed)
Most likely related to PVCs. I don't know that he would've noticed the very short burst of PSVT or VT. Those probably were not any longer and noticeable than just simple PVCs. Should be a fine on beta blockers especially with negative ischemic evaluation. Cannot further titrate beta blocker because of bradycardia.

## 2015-10-12 NOTE — Assessment & Plan Note (Signed)
Again he had one short episode on his monitor. Overall symptoms are better controlled on increased dose of Inderal, cannot titrate further. Discussed vagal maneuvers for breaking prolonged SVT spells. If he does have more symptoms, we can talk about using it when necessary short-acting beta blocker.

## 2015-10-12 NOTE — Assessment & Plan Note (Signed)
Back active now doing plenty of exercise without any chest pain or exertional dyspnea. Recent Myoview in February was negative / low risk. Normal EF. He is on aspirin, statin, ACE inhibitor and beta blocker.

## 2015-10-12 NOTE — Assessment & Plan Note (Signed)
Well-controlled today on his baseline dose of ACE inhibitor along with propranolol.

## 2015-10-12 NOTE — Assessment & Plan Note (Signed)
Labs followed by PCP. Tolerating Crestor.

## 2015-10-12 NOTE — Assessment & Plan Note (Signed)
Very short run. May be related to small prior infarct, no major infarct noted on Myoview and no ischemia noted. On stable dose of beta blocker, but cannot titrate up further because of baseline bradycardia.

## 2015-10-20 ENCOUNTER — Encounter (INDEPENDENT_AMBULATORY_CARE_PROVIDER_SITE_OTHER): Payer: Medicare HMO | Admitting: Ophthalmology

## 2015-10-20 DIAGNOSIS — H35033 Hypertensive retinopathy, bilateral: Secondary | ICD-10-CM

## 2015-10-20 DIAGNOSIS — I10 Essential (primary) hypertension: Secondary | ICD-10-CM

## 2015-10-20 DIAGNOSIS — H35373 Puckering of macula, bilateral: Secondary | ICD-10-CM

## 2015-10-20 DIAGNOSIS — H43813 Vitreous degeneration, bilateral: Secondary | ICD-10-CM

## 2016-04-11 ENCOUNTER — Encounter: Payer: Self-pay | Admitting: Cardiology

## 2016-04-11 ENCOUNTER — Ambulatory Visit (INDEPENDENT_AMBULATORY_CARE_PROVIDER_SITE_OTHER): Payer: Medicare HMO | Admitting: Cardiology

## 2016-04-11 VITALS — BP 128/64 | HR 72 | Ht 69.0 in | Wt 176.0 lb

## 2016-04-11 DIAGNOSIS — I4729 Other ventricular tachycardia: Secondary | ICD-10-CM

## 2016-04-11 DIAGNOSIS — F418 Other specified anxiety disorders: Secondary | ICD-10-CM

## 2016-04-11 DIAGNOSIS — I2510 Atherosclerotic heart disease of native coronary artery without angina pectoris: Secondary | ICD-10-CM

## 2016-04-11 DIAGNOSIS — I10 Essential (primary) hypertension: Secondary | ICD-10-CM

## 2016-04-11 DIAGNOSIS — I472 Ventricular tachycardia: Secondary | ICD-10-CM

## 2016-04-11 DIAGNOSIS — E785 Hyperlipidemia, unspecified: Secondary | ICD-10-CM

## 2016-04-11 DIAGNOSIS — I471 Supraventricular tachycardia: Secondary | ICD-10-CM | POA: Diagnosis not present

## 2016-04-11 DIAGNOSIS — R002 Palpitations: Secondary | ICD-10-CM

## 2016-04-11 MED ORDER — DIAZEPAM 5 MG PO TABS: 5.0000 mg | ORAL_TABLET | Freq: Three times a day (TID) | ORAL | 3 refills | Status: DC

## 2016-04-11 NOTE — Progress Notes (Signed)
PCP: Annetta Maw, MD  - [facility] Note: Chief Complaint  Patient presents with  . Follow-up    patient reports that he is "just not feeling good," trouble sleeping, pressure on top of head, fluctuating blood pressure-more elevated at night. hasn't been feeling well since PCP abruptly stopped valium.  . Coronary Artery Disease    HPI: Sean Lucas is a 75 y.o. male with a PMH below who presents today for six-month follow-up of coronary disease status Lucas CABG with history of nonsustained VT.Marland Kitchen He has a history of CAD with prior CABG in 1996 with LIMA to LAD, SVG to OM, SVG to RCA, HTN, palpitations & HLD. He had a basically normal event monitor back in August of 2014.   Sean Lucas was last seen in June of this year follow-up of a Myoview stress test done in January. In January saw him for possible palpitations. He was doing relatively well at that time. He did indicate that his grandson and had SVT. He says his symptoms are relatively well-controlled on Inderal. Negative Myoview in January of this year.  Recent Hospitalizations: none  Studies Reviewed: n/a  Interval History: Sean Lucas presents today really without any cardiac complaints - he "just doesn't feel well".  Has felt jittery & has not been sleeping well.  Has been anxious & noting pain from his L leg/foot.  Has not been eating or drinking - lost ~10 lb in the past ~1 month.  Has not been active.  Has had frequent Headaches as well as spells that simply seem like "panic attacks" with a sensation of heart racing/ chest tightness & flushing that can occur at any time.   Frequent nocturia.  Generalized fatigue.   All of these Sx, with the exception of the racing heart & chest tightness are non-cardiac.   Cardiac ROS: With the exception of chest tightness & dyspnea with flushing/increased HR, he denies any exertional CP or dyspnea.  No PND, orthopnea with controlled edema.  ++ Rapid, but no irregular  Heart Rate/rhythm. + lightheadedness &  Dizziness w/ "anxiety attacks" as well as generalized weakness - No r syncope/near syncope, or TIA/amaurosis fugax symptoms. No melena, hematochezia, hematuria, or epstaxis. No claudication.  ROS: A comprehensive was performed. Review of Systems  Constitutional: Positive for malaise/fatigue and weight loss (Per HPI - 10 lb in ~1 month).  HENT: Positive for congestion. Negative for hearing loss and nosebleeds.   Respiratory: Positive for shortness of breath. Negative for cough and wheezing.   Cardiovascular: Positive for leg swelling (trivial).       Per HPI  Gastrointestinal: Positive for constipation (off & on) and heartburn. Negative for abdominal pain, blood in stool, diarrhea and melena.  Genitourinary: Positive for frequency (Nocturia). Negative for dysuria.  Musculoskeletal: Positive for back pain and joint pain. Negative for falls and myalgias.  Skin: Negative.   Neurological: Positive for dizziness, tingling, weakness (generalized) and headaches. Negative for seizures and loss of consciousness.  Endo/Heme/Allergies: Negative for environmental allergies.  Psychiatric/Behavioral: Positive for memory loss. Negative for depression. The patient is nervous/anxious and has insomnia.   All other systems reviewed and are negative.  Past Medical History:  Diagnosis Date  . Aortic aneurysm ([facility])    By patient report  . CAD in native artery    Three-vessel disease, normal wall motion normal ejection fraction.  . CAD S/P percutaneous coronary angioplasty 1988   RCA PTCA in 1998 -- angina symptom = jaw and  tooth pain  . Chronic back pain   . Dyslipidemia, goal LDL below 70   . History of palpitations    Related to anxiety, much improved -- true etiology not established (No notabl eabnormalities on CardioNet - 12/2012)  . Hypertension   . Lucas-traumatic arthritis of lower leg     left lower leg suffered significant trauma with multiple surgeries to  reconstruct the foot. This gives him chronic foot and lower leg pain. He walks with a limp.   . 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 Statin intolerance     Past Surgical History:  Procedure Laterality Date  . CARDIAC CATHETERIZATION  07/04/1994   3-vessel disease normal wall motion and ejecton.--CABG x 3  . CardioNet Event Monitor  8-9 2014   Mostly NSR, occasional sinus bradycardia. No significant PACs, PVCs or arrhythmias.  . CORONARY ANGIOPLASTY  1988   RCA  . CORONARY ARTERY BYPASS GRAFT  07/05/1994   LIMA-LAD, SVG-OM, SVG-dRCA  . EVENT MONITOR  03/2015   Mostly NSR with rate S Tachy. Occasional PVCs   1 run of slow ~VT (7 beats - rate ~110 bpm).  1 run SVT  . NM LEXISCAN MYOVIEW  04/2015   EF ~53%. LOW RISK / normal.  Mild apical septal hypokinesis  . NM LEXISCAN MYOVIEW LTD  07/22/2012   Normal homogenous uptake, no ischemia or infarction. Previously noted anterior "thinning "not seen; EF 61%  . NM MYOVIEW LTD  March 2010   Small sized, mild intensity perfusion defect in the mid anterior wall, no reversibility. Either prior infarct or artifact. No evidence of ischemia    Current Meds  Medication Sig  . aspirin 325 MG tablet Take 325 mg by mouth daily.  . Cholecalciferol (VITAMIN D-3) 1000 UNITS CAPS Take 1,000 Units by mouth daily.  . diazepam (VALIUM) 5 MG tablet Take 1 tablet (5 mg total) by mouth 3 (three) times daily.  . meloxicam (MOBIC) 7.5 MG tablet Take 7.5 mg by mouth daily.  . Multiple Vitamins-Minerals (MULTIVITAMIN WITH MINERALS) tablet Take 1 tablet by mouth daily.  . nitroGLYCERIN (NITROSTAT) 0.4 MG SL tablet Place 0.4 mg under the tongue every 5 (five) minutes x 2 doses as needed for chest pain.  Marland Kitchen omeprazole (PRILOSEC) 20 MG capsule Take 20 mg by mouth daily.  . Oxycodone HCl 10 MG TABS Take 15 mg by mouth 2 (two) times daily.   . propranolol (INDERAL) 20 MG tablet Take 20 mg by mouth 2 (two) times daily.   .  psyllium (REGULOID) 0.52 G capsule Take 0.52 g by mouth daily.  . rosuvastatin (CRESTOR) 20 MG tablet Take 1 tablet (20 mg total) by mouth daily.  . Sennosides 15 MG TABS   . venlafaxine (EFFEXOR) 75 MG tablet Take 75 mg by mouth daily.  . [DISCONTINUED] diazepam (VALIUM) 5 MG tablet Take 5 mg by mouth 3 (three) times daily.  . [DISCONTINUED] lisinopril (PRINIVIL,ZESTRIL) 40 MG tablet Take 1 tablet (40 mg total) by mouth daily.    Allergies  Allergen Reactions  . Lipitor [Atorvastatin] Other (See Comments)    myalgias  . Simvastatin Other (See Comments)    myalgias    Social History   Social History  . Marital status: Married    Spouse name: N/A  . Number of children: N/A  . Years of education: N/A   Social History Main Topics  . Smoking status: Former Smoker  Quit date: 04/11/1986  . Smokeless tobacco: Former Systems developer    Types: Chew  . Alcohol use No  . Drug use: No  . Sexual activity: Not Asked   Other Topics Concern  . None   Social History Narrative   He is a married father of 50, grandfather to 16, great-grandfather of 4. He has a lot of difficulty with his feet. Walking on a treadmill really bothers his feet but he does try to be active. He denies any tobacco or alcohol use.     family history includes Cancer - Lung in his father; Leukemia in his mother and sister.  Wt Readings from Last 3 Encounters:  04/11/16 79.8 kg (176 lb)  10/11/15 83.9 kg (185 lb)  05/04/15 84.4 kg (186 lb)    PHYSICAL EXAM BP 128/64   Pulse 72   Ht 5\' 9"  (1.753 m)   Wt 79.8 kg (176 lb)   BMI 25.99 kg/m  General appearance: alert, cooperative, appears stated age, no distress and borderline obese HEENT: [place]/AT, EOMI, MMM, anicteric sclera Neck: no adenopathy, no carotid bruit and no JVD Lungs: clear to auscultation bilaterally, normal percussion bilaterally and non-labored Heart: RRR. Normal S1, S2. Soft SEM.  No R/G. Nondisplaced PMI.  Abdomen: soft, non-tender; bowel sounds  normal; no masses, no organomegaly; notable diastasis recti or possible incisional hernia from his previous surgeries. Extremities: extremities normal, atraumatic, no cyanosis, and edema; he wears thick workboots to support his left ankle. Walks with antalgic gait favoring the left foot Pulses: 2+ and symmetric; Neurologic: Mental status: Alert, oriented, thought content appropriate; Cranial nerves: normal (II-XII grossly intact); very jittery and shaky on exam with twitches almost myoclonic in nature. Intermittently pressured speech    Adult ECG Report n.a   Other studies Reviewed: Additional studies/ records that were reviewed today include:  Recent Labs:  No recent checks   ASSESSMENT / PLAN: Problem List Items Addressed This Visit    MV CAD - s/p CABG - Primary (Chronic)    He has not been active is much she used to be. Despite this is not noticing any true cardiac symptoms of chest tightness or pressure. Had a Myoview negative this past year as well as relatively normal echocardiogram. Nothing to explain etiology for his slow VT and brief run of PAT/PSVT.  Currently on propranolol as a beta blocker and lisinopril for ACE inhibitor along with Crestor. Is on aspirin alone.      Relevant Orders   Comprehensive metabolic panel (Completed)   Lipid panel (Completed)   Anxiety with somatic features (Chronic)    Unfortunate, I really can't talk to by cardiac symptoms today, because he is mostly concerned with his driving anxiety and dizziness etc. Apparently his new PCP stopped his diazepam completely. He is now starting to show what seemed like signs of withdrawal. He is jittery on exam. At this point, I think the benzodiazepine that had been there for myalgias or other musculoskeletal issues as well as anxiety has become something that he has been dependent upon. Unable to quit cold [place].  I suspect that the most acute abruption of his benzodiazepine may have exacerbated anxiety  attack/12. The flushing sensation and rapid heartbeat sensation are all consistent with an anxiety attack.  I think he needs to be back on benzodiazepine. I will provide refill prescriptions to get into the next month or 2 in which time he needs to establish a new primary physician who would build understand that his level of  anxiety and distress.      Relevant Medications   diazepam (VALIUM) 5 MG tablet   Dyslipidemia, goal LDL below 70 (Chronic)    Labs followed by PCP. Currently on Crestor. No adverse effect.      Relevant Orders   Comprehensive metabolic panel (Completed)   Lipid panel (Completed)   Essential hypertension (Chronic)    Controlled on current meds.      Relevant Orders   Comprehensive metabolic panel (Completed)   Lipid panel (Completed)   Heart palpitations   Relevant Orders   Comprehensive metabolic panel (Completed)   Lipid panel (Completed)   NSVT (nonsustained ventricular tachycardia) (HCC)    Probably asymptomatic. No recent events that would be symptomatic. No ischemia on Myoview and relatively normal echo. She is already on beta blocker - cannot be titrated up based on history of resting bradycardia.      Relevant Orders   Comprehensive metabolic panel (Completed)   Lipid panel (Completed)   SVT (supraventricular tachycardia) (HCC)    She had one short bursts on her event monitor, but has not been symptomatic.  He is on propranolol with notable improvement in the past.      Relevant Orders   Comprehensive metabolic panel (Completed)   Lipid panel (Completed)      Current medicines are reviewed at length with the patient today. (+/- concerns) -- new PC stopped his BZD "cold-turkey" The following changes have been made: I suspect some dependence on narcotics --> agree with planning PCI on [place].  Patient Instructions  NO CHANGES    REFILLED DIAZEPAM 5 MG FOR 3 MONTHS UNTIL YOU SEE YOUR  PRIMARY NO REFILL AFTER THIS ONE    Your physician  wants you to follow-up in [place]. You will receive a reminder letter in the mail two months in advance. If you don't receive a letter, please call our office to schedule the follow-up appointment.   If you need a refill on your cardiac medications before your next appointment, please call your pharmacy.    Studies Ordered:   Orders Placed This Encounter  Procedures  . Comprehensive metabolic panel  . Lipid panel      Glenetta Hew, M.D., M.S. Interventional Cardiologist   Pager # [phone] Phone # [phone] [address] [place], [place] [address]

## 2016-04-11 NOTE — Patient Instructions (Signed)
NO CHANGES    REFILLED DIAZEPAM 5 MG FOR 3 MONTHS UNTIL YOU SEE YOUR  PRIMARY NO REFILL AFTER THIS ONE    Your physician wants you to follow-up in [place]. You will receive a reminder letter in the mail two months in advance. If you don't receive a letter, please call our office to schedule the follow-up appointment.   If you need a refill on your cardiac medications before your next appointment, please call your pharmacy.

## 2016-04-12 ENCOUNTER — Other Ambulatory Visit: Payer: Self-pay

## 2016-04-12 ENCOUNTER — Telehealth: Payer: Self-pay | Admitting: Cardiology

## 2016-04-12 ENCOUNTER — Other Ambulatory Visit: Payer: Self-pay | Admitting: Cardiology

## 2016-04-12 DIAGNOSIS — Z79899 Other long term (current) drug therapy: Secondary | ICD-10-CM

## 2016-04-12 LAB — COMPREHENSIVE METABOLIC PANEL
ALT: 19 U/L (ref 9–46)
ALT: 18 U/L (ref 9–46)
AST: 20 U/L (ref 10–35)
AST: 21 U/L (ref 10–35)
Albumin: 4.5 g/dL (ref 3.6–5.1)
Albumin: 4.3 g/dL (ref 3.6–5.1)
Alkaline Phosphatase: 74 U/L (ref 40–115)
Alkaline Phosphatase: 72 U/L (ref 40–115)
BILIRUBIN TOTAL: 0.5 mg/dL (ref 0.2–1.2)
BILIRUBIN TOTAL: 0.4 mg/dL (ref 0.2–1.2)
BUN: 21 mg/dL (ref 7–25)
BUN: 23 mg/dL (ref 7–25)
CO2: 26 mmol/L (ref 20–31)
CO2: 19 mmol/L — AB (ref 20–31)
CREATININE: 1.16 mg/dL (ref 0.70–1.18)
Calcium: 9.4 mg/dL (ref 8.6–10.3)
Calcium: 9.5 mg/dL (ref 8.6–10.3)
Chloride: 100 mmol/L (ref 98–110)
Chloride: 101 mmol/L (ref 98–110)
Creat: 1.25 mg/dL — ABNORMAL HIGH (ref 0.70–1.18)
GLUCOSE: 111 mg/dL — AB (ref 65–99)
Glucose, Bld: 121 mg/dL — ABNORMAL HIGH (ref 65–99)
Potassium: 5.7 mmol/L — ABNORMAL HIGH (ref 3.5–5.3)
Potassium: 5.6 mmol/L — ABNORMAL HIGH (ref 3.5–5.3)
SODIUM: 134 mmol/L — AB (ref 135–146)
Sodium: 134 mmol/L — ABNORMAL LOW (ref 135–146)
TOTAL PROTEIN: 6.9 g/dL (ref 6.1–8.1)
Total Protein: 7.2 g/dL (ref 6.1–8.1)

## 2016-04-12 LAB — LIPID PANEL
Cholesterol: 135 mg/dL (ref <200)
HDL: 49 mg/dL (ref >40)
LDL CALC: 61 mg/dL (ref <100)
Total CHOL/HDL Ratio: 2.8 Ratio (ref <5.0)
Triglycerides: 127 mg/dL (ref <150)
VLDL: 25 mg/dL (ref <30)

## 2016-04-12 NOTE — Progress Notes (Signed)
Increased K+, per DOD(Hochrein) have pt repeat.  pt will be in 04-13-16 for blood draw, he states that he cannot come in today

## 2016-04-12 NOTE — Telephone Encounter (Signed)
°  Follow Up   Pt is requesting to speak to a nurse regarding coming back into the office as his Potassium was high at last visit. Pt states he is unsure if he is supposed to schedule an appointment to see Dr. Ellyn Hack or for repeat labs. Please call.

## 2016-04-12 NOTE — Telephone Encounter (Signed)
Discussed with pt

## 2016-04-13 ENCOUNTER — Telehealth: Payer: Self-pay | Admitting: *Deleted

## 2016-04-13 ENCOUNTER — Encounter: Payer: Self-pay | Admitting: Cardiology

## 2016-04-13 DIAGNOSIS — Z79899 Other long term (current) drug therapy: Secondary | ICD-10-CM

## 2016-04-13 DIAGNOSIS — E875 Hyperkalemia: Secondary | ICD-10-CM

## 2016-04-13 DIAGNOSIS — F418 Other specified anxiety disorders: Secondary | ICD-10-CM | POA: Insufficient documentation

## 2016-04-13 MED ORDER — LISINOPRIL 40 MG PO TABS: 40.0000 mg | ORAL_TABLET | Freq: Every day | ORAL | 9 refills | Status: DC

## 2016-04-13 NOTE — Assessment & Plan Note (Addendum)
Controlled on current meds.

## 2016-04-13 NOTE — Assessment & Plan Note (Signed)
Unfortunate, I really can't talk to by cardiac symptoms today, because he is mostly concerned with his driving anxiety and dizziness etc. Apparently his new PCP stopped his diazepam completely. He is now starting to show what seemed like signs of withdrawal. He is jittery on exam. At this point, I think the benzodiazepine that had been there for myalgias or other musculoskeletal issues as well as anxiety has become something that he has been dependent upon. Unable to quit cold [place].  I suspect that the most acute abruption of his benzodiazepine may have exacerbated anxiety attack/12. The flushing sensation and rapid heartbeat sensation are all consistent with an anxiety attack.  I think he needs to be back on benzodiazepine. I will provide refill prescriptions to get into the next month or 2 in which time he needs to establish a new primary physician who would build understand that his level of anxiety and distress.

## 2016-04-13 NOTE — Telephone Encounter (Addendum)
--  Spoke to patient.  Result given . Verbalized understanding . patient aware for blood pressure check next 04/19/16 at 2 pm  Nurse blood check only  With lab work bmp the same day     --- Message from Leonie Man, MD sent at 04/12/2016  8:49 PM EST ----- Liver function tests look good. Cholesterol levels also look good and stable. Triglycerides and HDL levels along with LDL levels are well within goal.  Unfortunately, kidney function and potassium level are not well controlled. -- For now would like to stop lisinopril, and encourage increased hydration over the next few days. Will need blood work with simple BMP along with BP check next week.  Glenetta Hew, MD

## 2016-04-13 NOTE — Assessment & Plan Note (Signed)
Labs followed by PCP. Currently on Crestor. No adverse effect.

## 2016-04-13 NOTE — Assessment & Plan Note (Signed)
He has not been active is much she used to be. Despite this is not noticing any true cardiac symptoms of chest tightness or pressure. Had a Myoview negative this past year as well as relatively normal echocardiogram. Nothing to explain etiology for his slow VT and brief run of PAT/PSVT.  Currently on propranolol as a beta blocker and lisinopril for ACE inhibitor along with Crestor. Is on aspirin alone.

## 2016-04-13 NOTE — Assessment & Plan Note (Signed)
She had one short bursts on her event monitor, but has not been symptomatic.  He is on propranolol with notable improvement in the past.

## 2016-04-13 NOTE — Assessment & Plan Note (Signed)
Probably asymptomatic. No recent events that would be symptomatic. No ischemia on Myoview and relatively normal echo. She is already on beta blocker - cannot be titrated up based on history of resting bradycardia.

## 2016-04-19 ENCOUNTER — Ambulatory Visit (INDEPENDENT_AMBULATORY_CARE_PROVIDER_SITE_OTHER): Payer: Medicare HMO | Admitting: *Deleted

## 2016-04-19 VITALS — BP 147/82 | HR 87

## 2016-04-19 DIAGNOSIS — I10 Essential (primary) hypertension: Secondary | ICD-10-CM | POA: Diagnosis not present

## 2016-04-19 LAB — BASIC METABOLIC PANEL
BUN: 16 mg/dL (ref 7–25)
CHLORIDE: 102 mmol/L (ref 98–110)
CO2: 26 mmol/L (ref 20–31)
Calcium: 9.3 mg/dL (ref 8.6–10.3)
Creat: 0.98 mg/dL (ref 0.70–1.18)
GLUCOSE: 95 mg/dL (ref 65–99)
Potassium: 4.7 mmol/L (ref 3.5–5.3)
Sodium: 136 mmol/L (ref 135–146)

## 2016-04-19 NOTE — Patient Instructions (Signed)
Dr. Ellyn Hack will be made aware of blood pressure reading from today.  Please have blood work completed today on 1st floor.  We will call with further recommendations and/or lab results.

## 2016-04-19 NOTE — Progress Notes (Signed)
Patient presents for BP check with nurse per Dr. Ellyn Hack (see telephone note 12/22).  Pt denies complaints.  BP 147/82 P-87.  Pt reports this is close to how his BP runs at home when he takes it.  Denies taking BP since d/c of Lisinopril.     Advised to monitor BP at home if possible.    Pt also reminded to have blood work completed today for follow up BMP.    Advised I would make Dr. Ellyn Hack aware of BP reading and we would call with recommendations and/or results from blood work.

## 2016-04-23 HISTORY — PX: CATARACT EXTRACTION W/ INTRAOCULAR LENS  IMPLANT, BILATERAL: SHX1307

## 2016-04-27 ENCOUNTER — Telehealth: Payer: Self-pay | Admitting: Cardiology

## 2016-04-27 NOTE — Telephone Encounter (Signed)
New Message   Pt calling to follow up on test results. Would like a call back.

## 2016-04-27 NOTE — Telephone Encounter (Signed)
Returned patient call-made aware of lab results and recommendations:  Notes Recorded by Leonie Man, MD on 04/26/2016 at 7:26 PM EST Follow-up potassium looks much better. Sodium level stable. Renal function improved.  Continue with our current regimen.  DH  Please forward to Dr. Annetta Maw   Pt verbalized understanding.  Routed to PCP.

## 2016-06-15 ENCOUNTER — Encounter: Payer: Self-pay | Admitting: Physician Assistant

## 2016-06-15 ENCOUNTER — Encounter: Payer: Medicare HMO | Admitting: Physician Assistant

## 2016-06-15 NOTE — Progress Notes (Signed)
This encounter was created in error - please disregard.

## 2016-06-15 NOTE — Progress Notes (Deleted)
Cardiology Office Note    Date:  06/15/2016   ID:  Sean Lucas, DOB March 15, 1941, MRN [id number]  PCP:  Annetta Maw, MD  Cardiologist:  Dr. Ellyn Hack  No chief complaint on file.   History of Present Illness:  Sean Lucas is a 76 y.o. male with PMH of CAD s/p CABG in 1996 with LIMA to LAD, SVG to OM, SVG to RCA, HTN, palpitation and HLD. He had a normal event monitor in August 2014. He also had a negative Myoview in January 2017. He is intolerant to Lipitor and the simvastatin however seems to be able to tolerate Crestor at this time. He was last seen in December 2017, at which time he complained he was not feeling very well. He felt jittery and has not been sleeping much. He also noted a pain from his left leg and foot. He lost a roughly 10 pounds during one month. He also had frequent headache as well. Dr. Ellyn Hack was concerned that he is diazepam was stopped by his new PCP and he was showing was some signs of withdraw. He was given a limited prescription of Valium 5 mg. His lab work in December showed elevated creatinine, his lisinopril was subsequently discontinued. Since then, repeat test showed stable creatinine. Lipid test was within normal limits. Liver function test was normal.  Repeat EKG Reason for visit? I do not see a phone note    Past Medical History:  Diagnosis Date  . Aortic aneurysm ([place])    By patient report  . CAD in native artery    Three-vessel disease, normal wall motion normal ejection fraction.  . CAD S/P percutaneous coronary angioplasty 1988   RCA PTCA in 1998 -- angina symptom = jaw and tooth pain  . Chronic back pain   . Dyslipidemia, goal LDL below 70   . History of palpitations    Related to anxiety, much improved -- true etiology not established (No notabl eabnormalities on CardioNet - 12/2012)  . Hypertension   . Post-traumatic arthritis of lower leg     left lower leg suffered significant trauma with multiple surgeries to reconstruct the  foot. This gives him chronic foot and lower leg pain. He walks with a limp.   . 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 Statin intolerance     Past Surgical History:  Procedure Laterality Date  . CARDIAC CATHETERIZATION  07/04/1994   3-vessel disease normal wall motion and ejecton.--CABG x 3  . CardioNet Event Monitor  8-9 2014   Mostly NSR, occasional sinus bradycardia. No significant PACs, PVCs or arrhythmias.  . CORONARY ANGIOPLASTY  1988   RCA  . CORONARY ARTERY BYPASS GRAFT  07/05/1994   LIMA-LAD, SVG-OM, SVG-dRCA  . EVENT MONITOR  03/2015   Mostly NSR with rate S Tachy. Occasional PVCs   1 run of slow ~VT (7 beats - rate ~110 bpm).  1 run SVT  . NM LEXISCAN MYOVIEW  04/2015   EF ~53%. LOW RISK / normal.  Mild apical septal hypokinesis  . NM LEXISCAN MYOVIEW LTD  07/22/2012   Normal homogenous uptake, no ischemia or infarction. Previously noted anterior "thinning "not seen; EF 61%  . NM MYOVIEW LTD  March 2010   Small sized, mild intensity perfusion defect in the mid anterior wall, no reversibility. Either prior infarct or artifact. No evidence of ischemia    Current Medications: Outpatient Medications Prior to Visit  Medication  Sig Dispense Refill  . aspirin 325 MG tablet Take 325 mg by mouth daily.    . Cholecalciferol (VITAMIN D-3) 1000 UNITS CAPS Take 1,000 Units by mouth daily.    . diazepam (VALIUM) 5 MG tablet Take 1 tablet (5 mg total) by mouth 3 (three) times daily. 90 tablet 3  . lisinopril (PRINIVIL,ZESTRIL) 40 MG tablet Take 1 tablet (40 mg total) by mouth daily. On hold starting 04/13/16 ,see labs 04/10/2016 svm 30 tablet 9  . meloxicam (MOBIC) 7.5 MG tablet Take 7.5 mg by mouth daily.    . Multiple Vitamins-Minerals (MULTIVITAMIN WITH MINERALS) tablet Take 1 tablet by mouth daily.    . nitroGLYCERIN (NITROSTAT) 0.4 MG SL tablet Place 0.4 mg under the tongue every 5 (five) minutes x 2 doses as needed for chest pain.     Marland Kitchen omeprazole (PRILOSEC) 20 MG capsule Take 20 mg by mouth daily.    . Oxycodone HCl 10 MG TABS Take 15 mg by mouth 2 (two) times daily.     . propranolol (INDERAL) 20 MG tablet Take 20 mg by mouth 2 (two) times daily.     . psyllium (REGULOID) 0.52 G capsule Take 0.52 g by mouth daily.    . rosuvastatin (CRESTOR) 20 MG tablet Take 1 tablet (20 mg total) by mouth daily. 30 tablet 6  . Sennosides 15 MG TABS     . venlafaxine (EFFEXOR) 75 MG tablet Take 75 mg by mouth daily.     No facility-administered medications prior to visit.      Allergies:   Lipitor [atorvastatin] and Simvastatin   Social History   Social History  . Marital status: Married    Spouse name: N/A  . Number of children: N/A  . Years of education: N/A   Social History Main Topics  . Smoking status: Former Smoker    Quit date: 04/11/1986  . Smokeless tobacco: Former Systems developer    Types: Chew  . Alcohol use No  . Drug use: No  . Sexual activity: Not on file   Other Topics Concern  . Not on file   Social History Narrative   He is a married father of 48, grandfather to 56, great-grandfather of 4. He has a lot of difficulty with his feet. Walking on a treadmill really bothers his feet but he does try to be active. He denies any tobacco or alcohol use.      Family History:  The patient's ***family history includes Cancer - Lung in his father; Leukemia in his mother and sister.   ROS:   Please see the history of present illness.    ROS All other systems reviewed and are negative.   PHYSICAL EXAM:   VS:  There were no vitals taken for this visit.   GEN: Well nourished, well developed, in no acute distress  HEENT: normal  Neck: no JVD, carotid bruits, or masses Cardiac: ***RRR; no murmurs, rubs, or gallops,no edema  Respiratory:  clear to auscultation bilaterally, normal work of breathing GI: soft, nontender, nondistended, + BS MS: no deformity or atrophy  Skin: warm and dry, no rash Neuro:  Alert and Oriented x  3, Strength and sensation are intact Psych: euthymic mood, full affect  Wt Readings from Last 3 Encounters:  04/11/16 176 lb (79.8 kg)  10/11/15 185 lb (83.9 kg)  05/04/15 186 lb (84.4 kg)      Studies/Labs Reviewed:   EKG:  EKG is*** ordered today.  The ekg ordered today demonstrates ***  Recent Labs: 04/12/2016: ALT 18 04/19/2016: BUN 16; Creat 0.98; Potassium 4.7; Sodium 136   Lipid Panel    Component Value Date/Time   CHOL 135 04/11/2016 1539   TRIG 127 04/11/2016 1539   HDL 49 04/11/2016 1539   CHOLHDL 2.8 04/11/2016 1539   VLDL 25 04/11/2016 1539   LDLCALC 61 04/11/2016 1539    Additional studies/ records that were reviewed today include:   Myoview 05/04/2015 Study Highlights    Nuclear stress EF: 53%.  The left ventricular ejection fraction is mildly decreased (45-54%).  There was no ST segment deviation noted during stress.  The study is normal.  This is a low risk study.  Mild hypokinesis of the apical septum.      ASSESSMENT:    No diagnosis found.   PLAN:  In order of problems listed above:  1. ***    Medication Adjustments/Labs and Tests Ordered: Current medicines are reviewed at length with the patient today.  Concerns regarding medicines are outlined above.  Medication changes, Labs and Tests ordered today are listed in the Patient Instructions below. There are no Patient Instructions on file for this visit.   Hilbert Corrigan, [place]  06/15/2016 1:13 PM    [facility] [address], [place], [place]  [address] Phone: [phone]; Fax: [phone]

## 2016-07-03 IMAGING — NM NM MISC PROCEDURE
6 series · 36 of 36 positions shown · non-contrast
Comparison: none

[Series 1: wbr_r-proj_st wbr rest · 6.40mm/px · 6 of 64 frames shown]
[frame 6/64]
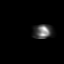
[frame 16/64]
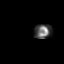
[frame 27/64]
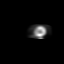
[frame 38/64]
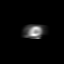
[frame 48/64]
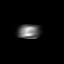
[frame 59/64]
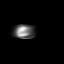

[Series 1: wbr rest · 6.40mm/px · 6 of 64 frames shown]
[frame 6/64]
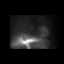
[frame 16/64]
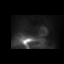
[frame 27/64]
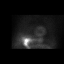
[frame 38/64]
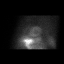
[frame 48/64]
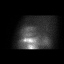
[frame 59/64]
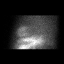

[Series 2: wbr stress-gsp · 6.40mm/px · 6 of 511 frames shown]
[frame 43/511]
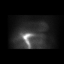
[frame 128/511]
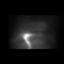
[frame 213/511]
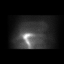
[frame 298/511]
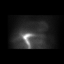
[frame 383/511]
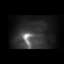
[frame 469/511]
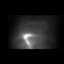

[Series 2: wbr_s-proj_st wbr stress-gsp · 6.40mm/px · 6 of 512 frames shown]
[frame 43/512]
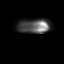
[frame 128/512]
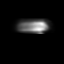
[frame 214/512]
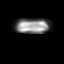
[frame 299/512]
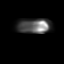
[frame 384/512]
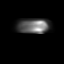
[frame 470/512]
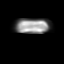

[Series 3: wbr stress-sum-em · 6.40mm/px · 6 of 64 frames shown]
[frame 6/64]
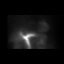
[frame 16/64]
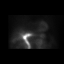
[frame 27/64]
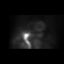
[frame 38/64]
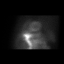
[frame 48/64]
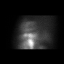
[frame 59/64]
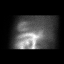

[Series 3: wbr_s-proj_st wbr stress-sum-em · 6.40mm/px · 6 of 64 frames shown]
[frame 6/64]
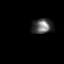
[frame 16/64]
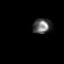
[frame 27/64]
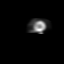
[frame 38/64]
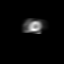
[frame 48/64]
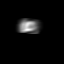
[frame 59/64]
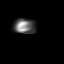

[36 of 36 positions shown; findings below may reference images not displayed]

Canned report from images found in remote index.

Refer to host system for actual result text.

## 2016-07-04 ENCOUNTER — Telehealth: Payer: Self-pay | Admitting: Cardiology

## 2016-07-04 DIAGNOSIS — Z79899 Other long term (current) drug therapy: Secondary | ICD-10-CM

## 2016-07-04 NOTE — Telephone Encounter (Signed)
New Message    Pt c/o BP issue: STAT if pt c/o blurred vision, one-sided weakness or slurred speech  1. What are your last 5 BP readings? 158/72, 160/68  2. Are you having any other symptoms (ex. Dizziness, headache, blurred vision, passed out)? Headache  3. What is your BP issue? Per pt BP has been up for about three days now  Requesting call back

## 2016-07-04 NOTE — Telephone Encounter (Signed)
Returned call to patient who states his BP has been elevated over last few days 154/72 (last night) 160/68 (today) -- patient takes clonidine in the AM (6am) - his BP was up after taking his medication.   Patient was previously on lisinopril but this was d/c'ed d/t renal function per patient - has been off for 6 months  Patient reports a headache and feels like his eyes will pop out of his head. He does not feel good at all - feels woozy.   Patient would like advice on what to do - states he does not want to have a stroke.   Increase clonidine dose/frequency? Add different class of BP med? Hypertension clinic appt?  Message routed to MD/RN

## 2016-07-05 NOTE — Telephone Encounter (Signed)
LMTCB

## 2016-07-05 NOTE — Telephone Encounter (Signed)
Restart Lisinopril 10 mg daily. BP in the 160 range is not dangerous - but would like it lower. If we can avoid Clonidine, I would prefer to do so.  Need to check BMP ~1 week after starting Lisinopril.  Will need to have f/u with APP or CVVR to monitor BP ~2-3 weeks after starting Lisinopril back.  [place]

## 2016-07-06 MED ORDER — LISINOPRIL 10 MG PO TABS: 10.0000 mg | ORAL_TABLET | Freq: Every day | ORAL | 1 refills | Status: AC

## 2016-07-06 NOTE — Telephone Encounter (Signed)
Patient has BP check appointment 07/24/2016

## 2016-07-06 NOTE — Telephone Encounter (Signed)
Patient returned call. Notified of MD advice on medications, labs, BP appt. Patient agreed w/plan. Med/lab ordered. Patient will have labs 3/26 or 3/27. Staff message sent to scheduling pool to contact patient for appt.

## 2016-07-16 ENCOUNTER — Telehealth: Payer: Self-pay | Admitting: Cardiology

## 2016-07-16 NOTE — Telephone Encounter (Signed)
New message   Pt is calling to ask about his upcoming appt. He says he was told to go to the 3rd floor but someone told him the first floor.

## 2016-07-16 NOTE — Telephone Encounter (Signed)
Clarified instructions for BP management visit, pt aware to come to office. He is also supposed to get a BMET done, I have given him instruction that the [place] lab in our building is on 1st floor on side hall, gave him walking directions for this. Pt verbalized understanding and thanks for call.

## 2016-09-14 ENCOUNTER — Ambulatory Visit: Payer: Medicare HMO | Admitting: Cardiology

## 2018-10-14 ENCOUNTER — Encounter: Payer: Self-pay | Admitting: *Deleted

## 2018-10-15 ENCOUNTER — Encounter: Payer: Self-pay | Admitting: *Deleted

## 2018-10-16 ENCOUNTER — Telehealth: Payer: Self-pay | Admitting: *Deleted

## 2018-10-16 NOTE — Telephone Encounter (Signed)
Offered Patient consultation by Jackquline Denmark or Telephone. Patient chose to have consultation by telephone.

## 2018-10-20 ENCOUNTER — Encounter: Payer: Self-pay | Admitting: Radiation Oncology

## 2018-10-20 ENCOUNTER — Telehealth: Payer: Self-pay | Admitting: Radiation Oncology
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
## 2018-10-20 NOTE — Telephone Encounter (Signed)
Contacted pt to verify for telephone visit for pre reg

## 2018-10-21 ENCOUNTER — Ambulatory Visit
Admission: RE | Admit: 2018-10-21 | Discharge: 2018-10-21 | Disposition: A | Discharge status: Home / Self Care | Payer: Medicare HMO | Source: Ambulatory Visit | Attending: Radiation Oncology | Admitting: Radiation Oncology

## 2018-10-21 ENCOUNTER — Other Ambulatory Visit: Payer: Self-pay

## 2018-10-21 ENCOUNTER — Encounter: Payer: Self-pay | Admitting: Radiation Oncology

## 2018-10-21 VITALS — Ht 68.0 in | Wt 175.0 lb

## 2018-10-21 DIAGNOSIS — C61 Malignant neoplasm of prostate: Secondary | ICD-10-CM

## 2018-10-21 HISTORY — DX: Malignant neoplasm of prostate: C61

## 2018-10-21 NOTE — Progress Notes (Signed)
See progress note under physician encounter. 
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
## 2018-10-30 ENCOUNTER — Other Ambulatory Visit: Payer: Self-pay | Admitting: Urology

## 2018-10-30 ENCOUNTER — Telehealth: Payer: Self-pay | Admitting: *Deleted

## 2018-10-30 NOTE — Telephone Encounter (Signed)
Called patient to inform of pre-seed planning CT for 12-11-18 - arrival time - 8:45 am, and his implant on 01-09-19 @ 7:30 am @ [facility], spoke with patient and he is aware of these appts.

## 2018-10-31 ENCOUNTER — Telehealth: Payer: Self-pay | Admitting: Medical Oncology

## 2018-10-31 NOTE — Telephone Encounter (Signed)
Attempted to reach patient as follow up to radiation consult with Dr. Tammi Klippel 6/30. No answer or answering machine. I called cell phone, no answer and mailbox is not set up. Will try later.

## 2018-11-18 ENCOUNTER — Other Ambulatory Visit: Payer: Self-pay | Admitting: Urology

## 2018-11-18 DIAGNOSIS — C61 Malignant neoplasm of prostate: Secondary | ICD-10-CM

## 2018-12-10 ENCOUNTER — Telehealth: Payer: Self-pay | Admitting: *Deleted

## 2018-12-10 NOTE — Telephone Encounter (Signed)
Called patient to remind of pre-seed appts. and chest x-ray and EKG for 12-11-18, spoke with patient and he is aware of these appts.

## 2018-12-11 ENCOUNTER — Ambulatory Visit
Admission: RE | Admit: 2018-12-11 | Discharge: 2018-12-11 | Disposition: A | Discharge status: Home / Self Care | Payer: Medicare HMO | Source: Ambulatory Visit | Attending: Urology | Admitting: Urology

## 2018-12-11 ENCOUNTER — Other Ambulatory Visit: Payer: Self-pay

## 2018-12-11 ENCOUNTER — Encounter: Payer: Self-pay | Admitting: Medical Oncology

## 2018-12-11 ENCOUNTER — Ambulatory Visit (HOSPITAL_COMMUNITY)
Admission: RE | Admit: 2018-12-11 | Discharge: 2018-12-11 | Disposition: A | Discharge status: Home / Self Care | Payer: Medicare HMO | Source: Ambulatory Visit | Attending: Urology | Admitting: Urology

## 2018-12-11 ENCOUNTER — Encounter (HOSPITAL_COMMUNITY)
Admission: RE | Admit: 2018-12-11 | Discharge: 2018-12-11 | Disposition: A | Discharge status: Home / Self Care | Payer: Medicare HMO | Source: Ambulatory Visit | Attending: Urology | Admitting: Urology

## 2018-12-11 ENCOUNTER — Ambulatory Visit
Admission: RE | Admit: 2018-12-11 | Discharge: 2018-12-11 | Disposition: A | Discharge status: Home / Self Care | Payer: Medicare HMO | Source: Ambulatory Visit | Attending: Radiation Oncology | Admitting: Radiation Oncology

## 2018-12-11 DIAGNOSIS — C61 Malignant neoplasm of prostate: Secondary | ICD-10-CM | POA: Insufficient documentation

## 2018-12-11 NOTE — Progress Notes (Signed)
  Radiation Oncology         [phone]) [phone] ________________________________  Name: Sean Lucas MRN: [id number]  Date: 12/11/2018  DOB: 08-21-1940  SIMULATION AND TREATMENT PLANNING NOTE PUBIC ARCH STUDY  [id number], [name], PA  Annetta Maw, MD  DIAGNOSIS: 78 y.o. gentleman with Stage T1c adenocarcinoma of the prostate with Gleason score of 4+3, and PSA of 7.55     ICD-10-CM   1. Malignant neoplasm of prostate ([place])  C61     COMPLEX SIMULATION:  The patient presented today for evaluation for possible prostate seed implant. He was brought to the radiation planning suite and placed supine on the CT couch. A 3-dimensional image study set was obtained in upload to the planning computer. There, on each axial slice, I contoured the prostate gland. Then, using three-dimensional radiation planning tools I reconstructed the prostate in view of the structures from the transperineal needle pathway to assess for possible pubic arch interference. In doing so, I did not appreciate any pubic arch interference. Also, the patient's prostate volume was estimated based on the drawn structure. The volume was 40 cc.  Given the pubic arch appearance and prostate volume, patient remains a good candidate to proceed with prostate seed implant. Today, he freely provided informed written consent to proceed.    PLAN: The patient will undergo prostate seed implant.   ________________________________  Sheral Apley. Tammi Klippel, M.D.

## 2019-01-06 ENCOUNTER — Other Ambulatory Visit (HOSPITAL_COMMUNITY)
Admission: RE | Admit: 2019-01-06 | Discharge: 2019-01-06 | Disposition: A | Discharge status: Home / Self Care | Payer: Medicare HMO | Source: Ambulatory Visit | Attending: Urology | Admitting: Urology

## 2019-01-06 ENCOUNTER — Other Ambulatory Visit: Payer: Self-pay

## 2019-01-06 ENCOUNTER — Encounter (HOSPITAL_COMMUNITY)
Admission: RE | Admit: 2019-01-06 | Discharge: 2019-01-06 | Disposition: A | Discharge status: Home / Self Care | Payer: Medicare HMO | Source: Ambulatory Visit | Attending: Urology | Admitting: Urology

## 2019-01-06 DIAGNOSIS — Z01812 Encounter for preprocedural laboratory examination: Secondary | ICD-10-CM | POA: Insufficient documentation

## 2019-01-06 DIAGNOSIS — Z20828 Contact with and (suspected) exposure to other viral communicable diseases: Secondary | ICD-10-CM | POA: Diagnosis not present

## 2019-01-06 DIAGNOSIS — K219 Gastro-esophageal reflux disease without esophagitis: Secondary | ICD-10-CM | POA: Diagnosis not present

## 2019-01-06 DIAGNOSIS — N401 Enlarged prostate with lower urinary tract symptoms: Secondary | ICD-10-CM | POA: Diagnosis not present

## 2019-01-06 DIAGNOSIS — E119 Type 2 diabetes mellitus without complications: Secondary | ICD-10-CM | POA: Diagnosis not present

## 2019-01-06 DIAGNOSIS — I10 Essential (primary) hypertension: Secondary | ICD-10-CM | POA: Diagnosis not present

## 2019-01-06 DIAGNOSIS — Z87891 Personal history of nicotine dependence: Secondary | ICD-10-CM | POA: Diagnosis not present

## 2019-01-06 DIAGNOSIS — G8929 Other chronic pain: Secondary | ICD-10-CM | POA: Diagnosis not present

## 2019-01-06 DIAGNOSIS — Z79899 Other long term (current) drug therapy: Secondary | ICD-10-CM | POA: Diagnosis not present

## 2019-01-06 DIAGNOSIS — Z7982 Long term (current) use of aspirin: Secondary | ICD-10-CM | POA: Diagnosis not present

## 2019-01-06 DIAGNOSIS — N138 Other obstructive and reflux uropathy: Secondary | ICD-10-CM | POA: Diagnosis not present

## 2019-01-06 DIAGNOSIS — N5201 Erectile dysfunction due to arterial insufficiency: Secondary | ICD-10-CM | POA: Diagnosis not present

## 2019-01-06 DIAGNOSIS — Z791 Long term (current) use of non-steroidal anti-inflammatories (NSAID): Secondary | ICD-10-CM | POA: Diagnosis not present

## 2019-01-06 DIAGNOSIS — Z7984 Long term (current) use of oral hypoglycemic drugs: Secondary | ICD-10-CM | POA: Diagnosis not present

## 2019-01-06 DIAGNOSIS — Z79891 Long term (current) use of opiate analgesic: Secondary | ICD-10-CM | POA: Diagnosis not present

## 2019-01-06 DIAGNOSIS — I2510 Atherosclerotic heart disease of native coronary artery without angina pectoris: Secondary | ICD-10-CM | POA: Diagnosis not present

## 2019-01-06 DIAGNOSIS — C61 Malignant neoplasm of prostate: Secondary | ICD-10-CM | POA: Diagnosis not present

## 2019-01-06 LAB — COMPREHENSIVE METABOLIC PANEL
ALT: 32 U/L (ref 0–44)
AST: 26 U/L (ref 15–41)
Albumin: 4.3 g/dL (ref 3.5–5.0)
Alkaline Phosphatase: 68 U/L (ref 38–126)
Anion gap: 6 (ref 5–15)
BUN: 18 mg/dL (ref 8–23)
CO2: 28 mmol/L (ref 22–32)
Calcium: 9.1 mg/dL (ref 8.9–10.3)
Chloride: 106 mmol/L (ref 98–111)
Creatinine, Ser: 0.85 mg/dL (ref 0.61–1.24)
GFR calc Af Amer: >60 mL/min (ref >60)
GFR calc non Af Amer: >60 mL/min (ref >60)
Glucose, Bld: 104 mg/dL — ABNORMAL HIGH (ref 70–99)
Potassium: 4.6 mmol/L (ref 3.5–5.1)
Sodium: 140 mmol/L (ref 135–145)
Total Bilirubin: 0.7 mg/dL (ref 0.3–1.2)
Total Protein: 7.2 g/dL (ref 6.5–8.1)

## 2019-01-06 LAB — CBC
HCT: 40.7 % (ref 39.0–52.0)
Hemoglobin: 13.4 g/dL (ref 13.0–17.0)
MCH: 30.9 pg (ref 26.0–34.0)
MCHC: 32.9 g/dL (ref 30.0–36.0)
MCV: 94 fL (ref 80.0–100.0)
Platelets: 241 10*3/uL (ref 150–400)
RBC: 4.33 MIL/uL (ref 4.22–5.81)
RDW: 12.4 % (ref 11.5–15.5)
WBC: 7.8 10*3/uL (ref 4.0–10.5)
nRBC: 0 % (ref 0.0–0.2)

## 2019-01-06 LAB — PROTIME-INR
INR: 1 (ref 0.8–1.2)
Prothrombin Time: 13.3 seconds (ref 11.4–15.2)

## 2019-01-06 LAB — APTT: aPTT: 29 seconds (ref 24–36)

## 2019-01-07 LAB — NOVEL CORONAVIRUS, NAA (HOSP ORDER, SEND-OUT TO REF LAB; TAT 18-24 HRS): SARS-CoV-2, NAA: NOT DETECTED

## 2019-01-08 ENCOUNTER — Telehealth: Payer: Self-pay | Admitting: *Deleted

## 2019-01-08 ENCOUNTER — Encounter (HOSPITAL_BASED_OUTPATIENT_CLINIC_OR_DEPARTMENT_OTHER): Payer: Self-pay | Admitting: *Deleted

## 2019-01-08 ENCOUNTER — Other Ambulatory Visit: Payer: Self-pay

## 2019-01-08 NOTE — H&P (Signed)
HPI: Sean Lucas is a 78 year-old male with prostate cancer.  His prostate cancer was diagnosed 09/23/2018. His PSA at his time of diagnosis was 7.55.   Prostate cancer  PSA 09/01/18 - 7.55, DRE - no nodularity or induration.  TRUS/Bx 09/23/18: Prostate volume - 41 cc  Pathology: Adenocarcinoma 2/12 cores positive.  4+3, 20%, left base lateral.  3+3, 5%, left apex lateral.  Stage: T1c.     ALLERGIES: No Allergies    MEDICATIONS: Aspirin  Myrbetriq  Omeprazole  Phenazopyridine Hcl 200 mg tablet 1 tablet PO Q 6 H  Sildenafil Citrate 20 mg tablet 5 tablet PO PRN  Sildenafil Citrate 20 mg tablet 1-5 tablet PO PRN  Tamsulosin Hcl 0.4 mg capsule 1 capsule PO Q PM  Viagra  Cholecalciferol  Glucosamine  Levaquin 750 mg tablet 1 tablet PO Daily Take the morning of your prostate biopsy.  Meloxicam  Nitroglycerin  Propranolol Hcl 20 mg tablet  Psyllium  Venlafaxine Hcl     GU PSH: Prostate Needle Biopsy - 09/23/2018       PSH Notes: Heart Surgery   NON-GU PSH: Surgical Pathology, Gross And Microscopic Examination For Prostate Needle - 09/23/2018     GU PMH: Elevated PSA (Stable) - 09/23/2018, (Worsening), He has elected to proceed with further evaluation of his elevating PSA. Fortunately he does not have any worrisome findings on DRE. He is going to stop his daily aspirin and return for prostate biopsy., - 09/08/2018 (Stable), His prostate was noted to be smooth and benign. His PSA is down slightly from where it has been in the past. I told him that I could not guarantee did not have prostate cancer causing his elevated PSA but it really has remained stable over the past year. I have recommended we continue to monitor this and will check a PSA again in 3 months and then see him back for DRE again in 6. , - 03/10/2018 (Worsening), His PSA is currently 5.72 with a free to total ratio of 21% indicating that is most likely elevated from benign causes. In addition his prostate was noted to be  unchanged and free of any nodularity. I have however recommended we continue following this a little bit more closely with a repeat PSA again in 3 months and then DRE and PSA in 6 months., - 09/02/2017 (Worsening), His PSA has fluctuated over time. His current 5.21. That is up from where it has been in the recent past but it has been higher previously. There has been no change in his DRE saw him going to see him back in 6 months for repeat DRE PSA., - 03/04/2017 (Stable), His prostate was noted to be smooth and benign with just some very subtle asymmetry. His PSA in 10/17 was 3.84 Will obtain a PSA today and continue to monitor his DRE and PSA every 6 months., - 2018 (Improving), His prostate was noted to be smooth and benign to examination today. His PSA at 3.84 is actually down from where has been in the past. He is very pleased to see this. I have recommended continued DRE and PSA every 6 months for now., - 2017, Elevated prostate specific antigen (PSA), - 2017 Dysuria (Stable), Has a very mild discomfort/sensation with urination that has persisted. His urine remains clear and free of any signs of infection. - 09/08/2018, He said he has had some dysuria but his urine was completely clear today. He also has weakening of his urinary stream any said it significant enough  he would like to try medication. I therefore will prescribe tamsulosin., - 09/02/2017 Weak Urinary Stream (Stable), Although he has experienced some slowing of his urinary stream is not particular bother to him. His urine was clear. - 03/10/2018 Urinary Urgency (Stable), I am going to try samples of Myrbetriq 25 mg to see if this helps with his urgency. - 03/04/2017, Urinary urgency, - 2017 ED due to arterial insufficiency, I have discussed with the patient the fact that sildenafil at a 20 mg dose used for the treatment of erectile dysfunction is considered an off label use of this medication by the FDA. We did discuss the fact that Viagra is  sildenafil. He understands that being used on label does not mean that the drug is unsafe or that it will not work but rather it is the same medication as Viagra at a different dosage. We discussed the common risks associated with sildenafil including allergic reaction such as itching, hives, swelling of the hands or face, swelling of the mouth or throat, chest tightness and trouble breathing. In addition we discussed the rare risk of priapism. He does understand lightheadedness, dizziness or fainting could occur as well as sudden changes in vision such as a blue tint, decreased hearing and headache in addition to others. He understands that he can receive a prescription for branded Viagra, a prescription for generic sildenafil to be filled at his local pharmacy, that he can purchase sildenafil from our office and that there are other treatment options available for his erectile dysfunction as well as electing not to be treated for this condition at all. Understanding that he has elected to proceed with the medication but did not have the funds today so he will return. 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   Bilateral renal cyst: CT scan in 5/15 which revealed bilateral hypodensities on a noncontrasted CT that were felt to likely represent cysts although renal ultrasound was recommended to confirm. He underwent a renal ultrasound on 08/11/14 which revealed a simple cyst of the right kidney  measuring 8 x 9 cm.   Erectile dysfunction: He also indicated he's been having some difficulty with erectile dysfunction in 11/17. He has used Viagra in the past at a 25 mg dose but this seems to be less than effective. He inquired about generic sildenafil.  Current therapy: Sildenafil     NON-GU PMH: Encounter for general adult medical examination without abnormal findings, Encounter for preventive health examination - 2017 Personal history of other diseases of the digestive system, History of esophageal reflux - 2014 Personal history of other endocrine, nutritional and metabolic disease, History of hypercholesterolemia - 2014 Personal history of other mental and behavioral disorders, History of depression - 2014    FAMILY HISTORY: Family Health Status Number - Runs In Family leukemia - Mother Lung Cancer - Father   SOCIAL HISTORY: Marital Status: Married Preferred Language: English; Ethnicity: Not Hispanic Or Latino; Race: White     Notes: Former smoker, Former smoker, Occupation:, Alcohol Use, Tobacco  Use, Death In The Family Mother, Caffeine Use, Death In The Family Father   REVIEW OF SYSTEMS:    GU Review Male:   Patient denies frequent urination, hard to postpone urination, burning/ pain with urination, get up at night to urinate, leakage of urine, stream starts and stops, trouble starting your stream, have to strain to urinate , erection problems, and penile pain.  Gastrointestinal (Upper):   Patient denies nausea, vomiting, and indigestion/ heartburn.  Gastrointestinal (Lower):   Patient denies diarrhea and constipation.  Constitutional:   Patient denies fever, night sweats, weight loss, and fatigue.  Skin:   Patient denies skin rash/ lesion and itching.  Eyes:   Patient denies blurred vision and double vision.  Ears/ Nose/ Throat:   Patient denies sore throat and sinus problems.  Hematologic/Lymphatic:   Patient denies easy bruising and swollen glands.  Cardiovascular:    Patient denies leg swelling and chest pains.  Respiratory:   Patient denies cough and shortness of breath.  Endocrine:   Patient denies excessive thirst.  Musculoskeletal:   Patient denies back pain and joint pain.  Neurological:   Patient denies headaches and dizziness.  Psychologic:   Patient denies depression and anxiety.   VITAL SIGNS:  Weight 173 lb / 78.47 kg  Height 69 in / 175.26 cm  BP 132/71 mmHg  Pulse 78 /min  Temperature 98.2 F / 36.7 C  BMI 25.5 kg/m   GU PHYSICAL EXAMINATION:    Anus and Perineum: No hemorrhoids. No anal stenosis. No rectal fissure, no anal fissure. No edema, no dimple, no perineal tenderness, no anal tenderness.  Prostate: Prostate 2 1/2+ size. Prostate right lobe larger. Left lobe normal consistency, right lobe normal consistency. No prostate nodule. Left lobe no tenderness, right lobe no tenderness.   Seminal Vesicles: Nonpalpable.  Sphincter Tone: Normal sphincter. No rectal tenderness. No rectal mass.    MULTI-SYSTEM PHYSICAL EXAMINATION:    Constitutional: Well-nourished. No physical deformities. Normally developed. Good grooming.  Neck: Neck symmetrical, not swollen. Normal tracheal position.  Respiratory: No labored breathing, no use of accessory muscles.   Cardiovascular: Normal temperature, normal extremity pulses, no swelling, no varicosities.  Skin: No paleness, no jaundice, no cyanosis. No lesion, no ulcer, no rash.  Neurologic / Psychiatric: Oriented to time, oriented to place, oriented to person. No depression, no anxiety, no agitation.  Gastrointestinal: No mass, no tenderness, no rigidity, non obese abdomen.  Musculoskeletal: Normal gait and station of head and neck.    PAST DATA REVIEWED:  Source Of History:  Patient   09/01/18 06/09/18 03/03/18 12/02/17 08/27/17 02/25/17 09/12/16 02/16/16  PSA  Total PSA 7.55 ng/mL 6.67 ng/mL 5.54 ng/mL 5.81 ng/mL 5.72 ng/mL 5.21 ng/mL 3.95 ng/dl 3.84 ng/dl  Free PSA 1.13 ng/mL 0.93 ng/mL 1.05  ng/mL 1.04 ng/mL 1.21 ng/mL 0.94 ng/mL    % Free PSA 15 % PSA 14 % PSA 19 % PSA 18 % PSA 21 % PSA 18 % PSA      PROCEDURES:          Urinalysis Dipstick Dipstick Cont'd  Color: Yellow Bilirubin: Neg mg/dL  Appearance: Clear Ketones: Neg mg/dL  Specific Gravity: 1.015 Blood: Neg ery/uL  pH: <=5.0 Protein: Neg mg/dL  Glucose: Neg mg/dL Urobilinogen: 0.2 mg/dL    Nitrites: Neg    Leukocyte Esterase: Neg leu/uL    ASSESSMENT/PLAN:      ICD-10 Details  1 GU:   Prostate Cancer -   I went over his pathology report with him today  as well as the significance of his Gleason score, number and location of cores positive and percent of cores positive. We then discussed his Partin table results in detail and the significance of these predictions as far as prognosis and need for further workup are concerned. I then discussed with him the various options available including active surveillance and treatment for cure such as radiation and surgery. We briefly discussed the forms of radiation available.   The patient was counseled about the natural history of prostate cancer and the standard treatment options that are available for prostate cancer. It was explained to him how his age and life expectancy, clinical stage, Gleason score, and PSA affect his prognosis, the decision to proceed with additional staging studies, as well as how that information influences recommended treatment strategies. We discussed the roles for active surveillance, radiation therapy, surgical therapy, androgen deprivation, as well as ablative therapy options for the treatment of prostate cancer as appropriate to his individual cancer situation. We discussed the risks and benefits of these options with regard to their impact on cancer control and also in terms of potential adverse events, complications, and impact on quality of life particularly related to urinary, bowel, and sexual function. The patient was encouraged to ask questions  throughout the discussion today and all questions were answered to his stated satisfaction.   He has elected to proceed with radioactive seed implant. I think that is reasonable as he has minimal comorbidities and does have unfavorable intermediate risk prostate cancer.

## 2019-01-08 NOTE — Progress Notes (Signed)
Spoke w/ Sean Lucas via phone for pre-op interview.  Npo after mn.  Arrive at 0530.  Sean Lucas had covid test  and lab work done 01-06-2019 results in chart and epic.  Current ekg and cxr in chart and epic.  Will take colace, effexor, clonidine, propranolol, flomax, prilosec, and oxycodone am dos w/ sips of water.  Sean Lucas verbalized understanding to do one fleet enema am dos.   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  Chest x-ray - 12-11-2018 epic EKG -  12-11-2018 epic Stress Test - 05-04-2015 epic ECHO - no Cardiac Cath - 07-04-1994 (in epic under media tab scanned in )  Sleep Study -  no CPAP -   Fasting Blood Sugar -  Sean Lucas does not check Checks Blood Sugar _____ times a day  Blood Thinner Instructions:  no Aspirin Instructions: ASA 81 mg,  Per Sean Lucas last dose 01-04-2019, given instructions by dr Tammi Klippel office Last Dose:  Anesthesia review:  Sean Lucas denies cardiac s&s, sob.  Stated has no taken a nitro in yrs.  Even monitory in epic 03-24-2015.    Patient denies shortness of breath, fever, cough and chest pain at phone interview.

## 2019-01-08 NOTE — Telephone Encounter (Signed)
CALLED PATIENT TO REMIND OF PROCEDURE FOR 01-09-19, SPOKE WITH PATIENT AND HE IS AWARE OF THIS PROCEDURE

## 2019-01-08 NOTE — Anesthesia Preprocedure Evaluation (Addendum)
Anesthesia Evaluation   Patient awake    Reviewed: Allergy & Precautions, Patient's Chart, lab work & pertinent test results, reviewed documented beta blocker date and time   Airway Mallampati: II  TM Distance: >3 FB Neck ROM: Full    Dental  (+) Lower Dentures, Upper Dentures,  Only 2 teeth lower:   Pulmonary former smoker,  Quit smoking 1987, 27 pack years   Pulmonary exam normal breath sounds clear to auscultation       Cardiovascular hypertension, Pt. on medications and Pt. 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    GI/Hepatic Neg liver ROS, GERD  Medicated,Previous esophageal dilation   Endo/Other  diabetes, Type 2, Oral Hypoglycemic Agents  Renal/GU    prostate cancer    Musculoskeletal  (+) Arthritis , Osteoarthritis,    Abdominal   Peds  Hematology   Anesthesia Other Findings Chronic pain- percocet, meloxicam Glasses L hearing aid Full dentures  Reproductive/Obstetrics                            Anesthesia Physical Anesthesia Plan  ASA: III  Anesthesia Plan: General   Post-op Pain Management:    Induction: Intravenous  PONV Risk Score and Plan: 2 and Ondansetron and Dexamethasone  Airway Management Planned: LMA  Additional Equipment: None  Intra-op Plan:   Post-operative Plan: Extubation in OR  Informed Consent: I have reviewed the patients History and Physical, chart, labs and discussed the procedure including the risks, benefits and alternatives for the proposed anesthesia with the patient or authorized representative who has  indicated his/her understanding and acceptance.     Dental advisory given  Plan Discussed with: CRNA  Anesthesia Plan Comments:        Anesthesia Quick Evaluation

## 2019-01-09 ENCOUNTER — Encounter (HOSPITAL_BASED_OUTPATIENT_CLINIC_OR_DEPARTMENT_OTHER): Payer: Self-pay | Admitting: Emergency Medicine

## 2019-01-09 ENCOUNTER — Ambulatory Visit (HOSPITAL_BASED_OUTPATIENT_CLINIC_OR_DEPARTMENT_OTHER)
Admission: RE | Admit: 2019-01-09 | Discharge: 2019-01-09 | Disposition: A | Discharge status: Home / Self Care | Payer: Medicare HMO | Attending: Urology | Admitting: Urology

## 2019-01-09 ENCOUNTER — Ambulatory Visit (HOSPITAL_BASED_OUTPATIENT_CLINIC_OR_DEPARTMENT_OTHER): Payer: Medicare HMO | Admitting: Anesthesiology

## 2019-01-09 ENCOUNTER — Encounter (HOSPITAL_BASED_OUTPATIENT_CLINIC_OR_DEPARTMENT_OTHER)
Admission: RE | Disposition: A | Discharge status: Home / Self Care | Payer: Self-pay | Source: Home / Self Care | Attending: Urology

## 2019-01-09 ENCOUNTER — Ambulatory Visit (HOSPITAL_COMMUNITY): Payer: Medicare HMO

## 2019-01-09 ENCOUNTER — Other Ambulatory Visit: Payer: Self-pay

## 2019-01-09 DIAGNOSIS — Z79899 Other long term (current) drug therapy: Secondary | ICD-10-CM | POA: Insufficient documentation

## 2019-01-09 DIAGNOSIS — C61 Malignant neoplasm of prostate: Secondary | ICD-10-CM | POA: Diagnosis not present

## 2019-01-09 DIAGNOSIS — Z791 Long term (current) use of non-steroidal anti-inflammatories (NSAID): Secondary | ICD-10-CM | POA: Insufficient documentation

## 2019-01-09 DIAGNOSIS — E119 Type 2 diabetes mellitus without complications: Secondary | ICD-10-CM | POA: Insufficient documentation

## 2019-01-09 DIAGNOSIS — Z7984 Long term (current) use of oral hypoglycemic drugs: Secondary | ICD-10-CM | POA: Insufficient documentation

## 2019-01-09 DIAGNOSIS — K219 Gastro-esophageal reflux disease without esophagitis: Secondary | ICD-10-CM | POA: Insufficient documentation

## 2019-01-09 DIAGNOSIS — I2510 Atherosclerotic heart disease of native coronary artery without angina pectoris: Secondary | ICD-10-CM | POA: Insufficient documentation

## 2019-01-09 DIAGNOSIS — N401 Enlarged prostate with lower urinary tract symptoms: Secondary | ICD-10-CM | POA: Diagnosis not present

## 2019-01-09 DIAGNOSIS — N138 Other obstructive and reflux uropathy: Secondary | ICD-10-CM | POA: Insufficient documentation

## 2019-01-09 DIAGNOSIS — Z79891 Long term (current) use of opiate analgesic: Secondary | ICD-10-CM | POA: Insufficient documentation

## 2019-01-09 DIAGNOSIS — Z87891 Personal history of nicotine dependence: Secondary | ICD-10-CM | POA: Insufficient documentation

## 2019-01-09 DIAGNOSIS — N5201 Erectile dysfunction due to arterial insufficiency: Secondary | ICD-10-CM | POA: Insufficient documentation

## 2019-01-09 DIAGNOSIS — Z7982 Long term (current) use of aspirin: Secondary | ICD-10-CM | POA: Insufficient documentation

## 2019-01-09 DIAGNOSIS — G8929 Other chronic pain: Secondary | ICD-10-CM | POA: Insufficient documentation

## 2019-01-09 DIAGNOSIS — I10 Essential (primary) hypertension: Secondary | ICD-10-CM | POA: Insufficient documentation

## 2019-01-09 HISTORY — DX: Type 2 diabetes mellitus without complications: E11.9

## 2019-01-09 HISTORY — DX: Chronic pain syndrome: G89.4

## 2019-01-09 HISTORY — DX: Palpitations: R00.2

## 2019-01-09 HISTORY — DX: Unspecified osteoarthritis, unspecified site: M19.90

## 2019-01-09 HISTORY — DX: Other constipation: K59.09

## 2019-01-09 HISTORY — DX: Other specified postprocedural states: Z98.890

## 2019-01-09 HISTORY — PX: RADIOACTIVE SEED IMPLANT: SHX5150

## 2019-01-09 HISTORY — DX: Presence of spectacles and contact lenses: Z97.3

## 2019-01-09 HISTORY — DX: Personal history of other (healed) physical injury and trauma: Z87.828

## 2019-01-09 HISTORY — PX: SPACE OAR INSTILLATION: SHX6769

## 2019-01-09 HISTORY — PX: CYSTOSCOPY: SHX5120

## 2019-01-09 HISTORY — DX: Complete loss of teeth, unspecified cause, unspecified class: K08.109

## 2019-01-09 HISTORY — DX: Gastro-esophageal reflux disease without esophagitis: K21.9

## 2019-01-09 HISTORY — DX: Atherosclerotic heart disease of native coronary artery without angina pectoris: I25.10

## 2019-01-09 HISTORY — DX: Presence of external hearing-aid: Z97.4

## 2019-01-09 LAB — GLUCOSE, CAPILLARY
Glucose-Capillary: 136 mg/dL — ABNORMAL HIGH (ref 70–99)
Glucose-Capillary: 121 mg/dL — ABNORMAL HIGH (ref 70–99)

## 2019-01-09 SURGERY — INSERTION, RADIATION SOURCE, PROSTATE
Anesthesia: General | Site: Rectum

## 2019-01-09 MED ORDER — HYDROMORPHONE HCL 1 MG/ML IJ SOLN
0.2500 mg | INTRAMUSCULAR | Status: DC | PRN
  Administered 2019-01-09 (×2): 0.25 mg via INTRAVENOUS
  Filled 2019-01-09: qty 0.5

## 2019-01-09 MED ORDER — MIDAZOLAM HCL 5 MG/5ML IJ SOLN
INTRAMUSCULAR | Status: DC | PRN
  Administered 2019-01-09: 1 mg via INTRAVENOUS

## 2019-01-09 MED ORDER — OXYCODONE HCL 5 MG PO TABS
ORAL_TABLET | ORAL | Status: AC
  Filled 2019-01-09: qty 1

## 2019-01-09 MED ORDER — CIPROFLOXACIN IN D5W 400 MG/200ML IV SOLN
INTRAVENOUS | Status: AC
  Filled 2019-01-09: qty 200

## 2019-01-09 MED ORDER — DEXAMETHASONE SODIUM PHOSPHATE 10 MG/ML IJ SOLN
INTRAMUSCULAR | Status: AC
  Filled 2019-01-09: qty 1

## 2019-01-09 MED ORDER — ONDANSETRON HCL 4 MG/2ML IJ SOLN
INTRAMUSCULAR | Status: AC
  Filled 2019-01-09: qty 2

## 2019-01-09 MED ORDER — PROPOFOL 10 MG/ML IV BOLUS
INTRAVENOUS | Status: DC | PRN
  Administered 2019-01-09: 100 mg via INTRAVENOUS

## 2019-01-09 MED ORDER — ONDANSETRON HCL 4 MG/2ML IJ SOLN
INTRAMUSCULAR | Status: DC | PRN
  Administered 2019-01-09: 4 mg via INTRAVENOUS

## 2019-01-09 MED ORDER — CIPROFLOXACIN IN D5W 400 MG/200ML IV SOLN
400.0000 mg | INTRAVENOUS | Status: AC
  Administered 2019-01-09: 400 mg via INTRAVENOUS
  Filled 2019-01-09: qty 200

## 2019-01-09 MED ORDER — ACETAMINOPHEN 500 MG PO TABS
ORAL_TABLET | ORAL | Status: AC
  Filled 2019-01-09: qty 2

## 2019-01-09 MED ORDER — IOHEXOL 300 MG/ML  SOLN
INTRAMUSCULAR | Status: DC | PRN
  Administered 2019-01-09: 7 mL

## 2019-01-09 MED ORDER — EPHEDRINE SULFATE 50 MG/ML IJ SOLN
INTRAMUSCULAR | Status: DC | PRN
  Administered 2019-01-09 (×4): 10 mg via INTRAVENOUS

## 2019-01-09 MED ORDER — FLEET ENEMA 7-19 GM/118ML RE ENEM
1.0000 | ENEMA | Freq: Once | RECTAL | Status: DC
  Filled 2019-01-09: qty 1

## 2019-01-09 MED ORDER — LIDOCAINE HCL (CARDIAC) PF 100 MG/5ML IV SOSY
PREFILLED_SYRINGE | INTRAVENOUS | Status: DC | PRN
  Administered 2019-01-09: 60 mg via INTRAVENOUS

## 2019-01-09 MED ORDER — HYDROCODONE-ACETAMINOPHEN 10-325 MG PO TABS: 1.0000 | ORAL_TABLET | ORAL | 0 refills | Status: AC | PRN

## 2019-01-09 MED ORDER — FENTANYL CITRATE (PF) 100 MCG/2ML IJ SOLN
INTRAMUSCULAR | Status: DC | PRN
  Administered 2019-01-09: 50 ug via INTRAVENOUS

## 2019-01-09 MED ORDER — ACETAMINOPHEN 500 MG PO TABS
1000.0000 mg | ORAL_TABLET | Freq: Once | ORAL | Status: DC
  Filled 2019-01-09: qty 2

## 2019-01-09 MED ORDER — FENTANYL CITRATE (PF) 100 MCG/2ML IJ SOLN
INTRAMUSCULAR | Status: AC
  Filled 2019-01-09: qty 2

## 2019-01-09 MED ORDER — KETOROLAC TROMETHAMINE 30 MG/ML IJ SOLN
15.0000 mg | Freq: Once | INTRAMUSCULAR | Status: DC | PRN
  Filled 2019-01-09: qty 1

## 2019-01-09 MED ORDER — MIDAZOLAM HCL 2 MG/2ML IJ SOLN
INTRAMUSCULAR | Status: AC
  Filled 2019-01-09: qty 2

## 2019-01-09 MED ORDER — LIDOCAINE 2% (20 MG/ML) 5 ML SYRINGE
INTRAMUSCULAR | Status: AC
  Filled 2019-01-09: qty 5

## 2019-01-09 MED ORDER — HYDROMORPHONE HCL 1 MG/ML IJ SOLN
INTRAMUSCULAR | Status: AC
  Filled 2019-01-09: qty 1

## 2019-01-09 MED ORDER — OXYCODONE HCL 5 MG PO TABS
5.0000 mg | ORAL_TABLET | Freq: Once | ORAL | Status: AC | PRN
  Administered 2019-01-09: 5 mg via ORAL
  Filled 2019-01-09: qty 1

## 2019-01-09 MED ORDER — ONDANSETRON HCL 4 MG/2ML IJ SOLN
4.0000 mg | Freq: Once | INTRAMUSCULAR | Status: DC | PRN
  Filled 2019-01-09: qty 2

## 2019-01-09 MED ORDER — PROPOFOL 10 MG/ML IV BOLUS
INTRAVENOUS | Status: AC
  Filled 2019-01-09: qty 20

## 2019-01-09 MED ORDER — OXYCODONE HCL 5 MG/5ML PO SOLN
5.0000 mg | Freq: Once | ORAL | Status: AC | PRN
  Filled 2019-01-09: qty 5

## 2019-01-09 MED ORDER — SODIUM CHLORIDE (PF) 0.9 % IJ SOLN
INTRAMUSCULAR | Status: DC | PRN
  Administered 2019-01-09: 10 mL

## 2019-01-09 MED ORDER — LACTATED RINGERS IV SOLN
INTRAVENOUS | Status: DC
  Administered 2019-01-09: 06:00:00 via INTRAVENOUS
  Filled 2019-01-09: qty 1000

## 2019-01-09 SURGICAL SUPPLY — 38 items
BAG URINE DRAINAGE (UROLOGICAL SUPPLIES) ×4 IMPLANT
BLADE CLIPPER SENSICLIP SURGIC (BLADE) ×4 IMPLANT
CATH FOLEY 2WAY SLVR  5CC 16FR (CATHETERS) ×1
CATH FOLEY 2WAY SLVR 5CC 16FR (CATHETERS) ×3 IMPLANT
CATH ROBINSON RED A/P 16FR (CATHETERS) IMPLANT
CATH ROBINSON RED A/P 20FR (CATHETERS) ×4 IMPLANT
CLOTH BEACON ORANGE TIMEOUT ST (SAFETY) ×4 IMPLANT
CONT SPECI 4OZ STER CLIK (MISCELLANEOUS) ×8 IMPLANT
COVER BACK TABLE 60X90IN (DRAPES) ×4 IMPLANT
COVER MAYO STAND STRL (DRAPES) ×4 IMPLANT
DRSG TEGADERM 4X4.75 (GAUZE/BANDAGES/DRESSINGS) ×7 IMPLANT
DRSG TEGADERM 8X12 (GAUZE/BANDAGES/DRESSINGS) ×8 IMPLANT
GAUZE SPONGE 4X4 12PLY STRL (GAUZE/BANDAGES/DRESSINGS) ×1 IMPLANT
GLOVE BIO SURGEON STRL SZ 6.5 (GLOVE) ×1 IMPLANT
GLOVE BIO SURGEON STRL SZ7.5 (GLOVE) IMPLANT
GLOVE BIO SURGEON STRL SZ8 (GLOVE) ×4 IMPLANT
GLOVE BIOGEL PI IND STRL 6.5 (GLOVE) IMPLANT
GLOVE BIOGEL PI IND STRL 7.5 (GLOVE) IMPLANT
GLOVE BIOGEL PI INDICATOR 6.5 (GLOVE) ×1
GLOVE BIOGEL PI INDICATOR 7.5 (GLOVE) ×1
GLOVE SURG ORTHO 8.5 STRL (GLOVE) ×4 IMPLANT
GLOVE SURG SS PI 6.5 STRL IVOR (GLOVE) IMPLANT
GOWN STRL REUS W/TWL XL LVL3 (GOWN DISPOSABLE) ×4 IMPLANT
HOLDER FOLEY CATH W/STRAP (MISCELLANEOUS) IMPLANT
IMPL SPACEOAR SYSTEM 10ML (Spacer) ×3 IMPLANT
IMPLANT SPACEOAR SYSTEM 10ML (Spacer) ×4 IMPLANT
IV NS 1000ML (IV SOLUTION) ×4
IV NS 1000ML BAXH (IV SOLUTION) ×3 IMPLANT
KIT TURNOVER CYSTO (KITS) ×4 IMPLANT
MARKER SKIN DUAL TIP RULER LAB (MISCELLANEOUS) ×4 IMPLANT
PACK CYSTO (CUSTOM PROCEDURE TRAY) ×4 IMPLANT
SURGILUBE 2OZ TUBE FLIPTOP (MISCELLANEOUS) ×4 IMPLANT
SUT BONE WAX W31G (SUTURE) IMPLANT
SYR 10ML LL (SYRINGE) IMPLANT
TOWEL OR 17X26 10 PK STRL BLUE (TOWEL DISPOSABLE) ×4 IMPLANT
UNDERPAD 30X30 (UNDERPADS AND DIAPERS) ×8 IMPLANT
WATER STERILE IRR 500ML POUR (IV SOLUTION) ×4 IMPLANT
i-seed agx100 ×77 IMPLANT

## 2019-01-09 NOTE — Discharge Instructions (Signed)

## 2019-01-09 NOTE — Op Note (Signed)
PATIENT:  Sean Lucas  PRE-OPERATIVE DIAGNOSIS:  Adenocarcinoma of the prostate  POST-OPERATIVE DIAGNOSIS:  Same  PROCEDURE:  1. I-125 radioactive seed implantation 2. Cystoscopy  3. Placement of SpaceOAR 4. Fluoroscopy use with time less than 1 hour.  SURGEON:  Surgeon(s): Claybon Jabs  Radiation oncologist: Dr. Tyler Pita  ANESTHESIA:  General  EBL:  Minimal  DRAINS: None  INDICATION: Sean Lucas is a 78 year old male with biopsy-proven adenocarcinoma the prostate.  He underwent prostate biopsy on 09/23/2018 which revealed a 41 cc prostate.  2/12 cores were positive with 1 core showing 4+3 and a second 3+3.  Stage T1c he presents today for radioactive seed implant.  Description of procedure: After informed consent the patient was brought to the major OR, placed on the table and administered general anesthesia. He was then moved to the modified lithotomy position with his perineum perpendicular to the floor. His perineum and genitalia were then sterilely prepped. An official timeout was then performed. A 16 French Foley catheter was then placed in the bladder and filled with dilute contrast, a rectal tube was placed in the rectum and the transrectal ultrasound probe was placed in the rectum and affixed to the stand. He was then sterilely draped.  Real time ultrasonography was used along with the seed planning software [employer] Prostate vs. [id number]. This was used to develop the seed plan including the number of needles as well as number of seeds required for complete and adequate coverage.  The needles were then preloaded with seeds and spacers according to the previously developed plan.  Real-time ultrasonography was then used along with the previously developed plan to implant a total of 77 seeds using 27 needles. This proceeded without difficulty or complication.   I then proceeded with placement of SpaceOAR by introducing a needle with the bevel angled inferiorly  approximately 2 cm superior to the anus. This was angled downward and under direct ultrasound was placed within the space between the prostatic capsule and rectum. This was confirmed with a small amount of sterile saline injected and this was performed under direct ultrasound. I then attached the SpaceOAR to the needle and injected this in the space between the prostate and rectum with good placement noted.  A Foley catheter was then removed as well as the transrectal ultrasound probe and rectal probe. Flexible cystoscopy was then performed using the 17 French flexible scope which revealed a normal urethra throughout its length down to the sphincter which appeared intact. The prostatic urethra revealed bilobar hypertrophy but no evidence of obstruction, seeds, spacers or lesions. The bladder was then entered and fully and systematically inspected. The ureteral orifices were noted to be of normal configuration and position. The mucosa revealed no evidence of tumors. There were also no stones identified within the bladder. I noted no seeds or spacers on the floor of the bladder and retroflexion of the scope revealed no seeds protruding from the base of the prostate.  C-arm fluoroscopy was then used to evaluate the distribution of seeds placement and aid in determining confirmation of the number of seeds placed.  Real-time fluoroscopy was used with saved images revealing the location of the seeds placed both in AP and oblique views.  The cystoscope was then removed and the patient was awakened and taken to recovery room in stable and satisfactory condition. He tolerated procedure well and there were no intraoperative complications.

## 2019-01-09 NOTE — Transfer of Care (Signed)
Immediate Anesthesia Transfer of Care Note  Patient: Sean Lucas  Procedure(s) Performed: RADIOACTIVE SEED IMPLANT/BRACHYTHERAPY IMPLANT (N/A Prostate) SPACE OAR INSTILLATION (N/A Rectum) CYSTOSCOPY FLEXIBLE (N/A Bladder)  Patient Location: PACU  Anesthesia Type:General  Level of Consciousness: awake, alert  and oriented  Airway & Oxygen Therapy: Patient Spontanous Breathing and Patient connected to nasal cannula oxygen  Post-op Assessment: Report given to RN and Post -op Vital signs reviewed and stable  Post vital signs: Reviewed and stable  Last Vitals:  Vitals Value Taken Time  BP 146/65 01/09/19 0854  Temp 37.1 C 01/09/19 0854  Pulse 73 01/09/19 0859  Resp 17 01/09/19 0859  SpO2 100 % 01/09/19 0859  Vitals shown include unvalidated device data.  Last Pain:  Vitals:   01/09/19 0608  TempSrc: Oral  PainSc: 4       Patients Stated Pain Goal: 4 ([id number])  Complications: No apparent anesthesia complications

## 2019-01-09 NOTE — Anesthesia Postprocedure Evaluation (Signed)
Anesthesia Post Note  Patient: Sean Lucas  Procedure(s) Performed: RADIOACTIVE SEED IMPLANT/BRACHYTHERAPY IMPLANT (N/A Prostate) SPACE OAR INSTILLATION (N/A Rectum) CYSTOSCOPY FLEXIBLE (N/A Bladder)     Patient location during evaluation: PACU Anesthesia Type: General Level of consciousness: awake and alert Pain management: pain level controlled Vital Signs Assessment: post-procedure vital signs reviewed and stable Respiratory status: spontaneous breathing, nonlabored ventilation and respiratory function stable Cardiovascular status: blood pressure returned to baseline and stable Postop Assessment: no apparent nausea or vomiting Anesthetic complications: no    Last Vitals:  Vitals:   01/09/19 0915 01/09/19 0930  BP: (!) 174/76 (!) 164/73  Pulse: 69 65  Resp: 13 13  Temp:    SpO2: 100% 100%    Last Pain:  Vitals:   01/09/19 0900  TempSrc:   PainSc: 10-Worst pain ever                 Pervis Hocking

## 2019-01-09 NOTE — Anesthesia Procedure Notes (Signed)
Procedure Name: LMA Insertion Date/Time: 01/09/2019 7:50 AM Performed by: Bufford Spikes, CRNA Pre-anesthesia Checklist: Patient identified, Emergency Drugs available, Suction available and Patient being monitored Patient Re-evaluated:Patient Re-evaluated prior to induction Oxygen Delivery Method: Circle system utilized Preoxygenation: Pre-oxygenation with 100% oxygen Induction Type: IV induction Ventilation: Mask ventilation without difficulty LMA: LMA inserted LMA Size: 5.0 Number of attempts: 1 Airway Equipment and Method: Bite block Placement Confirmation: positive ETCO2 Tube secured with: Tape Dental Injury: Teeth and Oropharynx as per pre-operative assessment

## 2019-01-11 NOTE — Progress Notes (Signed)
  Radiation Oncology         [phone] ________________________________  Name: Sean Lucas MRN: [id number]  Date: 01/11/2019  DOB: 02-Aug-1940       Prostate Seed Implant  [id number], [name], PA  No ref. provider found  DIAGNOSIS: 78 y.o. gentleman with Stage T1c adenocarcinoma of the prostate with Gleason score of 4+3, and PSA of 7.55.    ICD-10-CM   1. Prostate cancer ([place])  C61 DG Chest 2 View    DG Chest 2 View    Discharge patient    PROCEDURE: Insertion of radioactive I-125 seeds into the prostate gland.  RADIATION DOSE: 145 Gy, definitive therapy.  TECHNIQUE: [name] was brought to the operating room with the urologist. He was placed in the dorsolithotomy position. He was catheterized and a rectal tube was inserted. The perineum was shaved, prepped and draped. The ultrasound probe was then introduced into the rectum to see the prostate gland.  TREATMENT DEVICE: A needle grid was attached to the ultrasound probe stand and anchor needles were placed.  3D PLANNING: The prostate was imaged in 3D using a sagittal sweep of the prostate probe. These images were transferred to the planning computer. There, the prostate, urethra and rectum were defined on each axial reconstructed image. Then, the software created an optimized 3D plan and a few seed positions were adjusted. The quality of the plan was reviewed using [name] information for the target and the following two organs at risk:  Urethra and Rectum.  Then the accepted plan was printed and handed off to the radiation therapist.  Under my supervision, the custom loading of the seeds and spacers was carried out and loaded into sealed vicryl sleeves.  These pre-loaded needles were then placed into the needle holder.Marland Kitchen  PROSTATE VOLUME STUDY:  Using transrectal ultrasound the volume of the prostate was verified to be 51.9 cc.  SPECIAL TREATMENT PROCEDURE/SUPERVISION AND HANDLING: The pre-loaded needles were then delivered  under sagittal guidance. A total of 28 needles were used to deposit 77 seeds in the prostate gland. The individual seed activity was 0.485 mCi.  SpaceOAR:  Yes  COMPLEX SIMULATION: At the end of the procedure, an anterior radiograph of the pelvis was obtained to document seed positioning and count. Cystoscopy was performed to check the urethra and bladder.  MICRODOSIMETRY: At the end of the procedure, the patient was emitting 0.182 mR/hr at 1 meter. Accordingly, he was considered safe for hospital discharge.  PLAN: The patient will return to the radiation oncology clinic for post implant CT dosimetry in three weeks.   ________________________________  Sheral Apley Tammi Klippel, M.D.

## 2019-01-12 ENCOUNTER — Encounter (HOSPITAL_BASED_OUTPATIENT_CLINIC_OR_DEPARTMENT_OTHER): Payer: Self-pay | Admitting: Urology

## 2019-01-21 ENCOUNTER — Telehealth: Payer: Self-pay | Admitting: *Deleted

## 2019-01-21 NOTE — Telephone Encounter (Signed)
CALLED PATIENT TO INFORM OF POST SEED APPTS. FOR 01-22-19 AND MRI FOR 01-22-19, SPOKE WITH PATIENT'S WIFE- DIANNE AND SHE IS AWARE OF THESE APPTS.

## 2019-01-22 ENCOUNTER — Ambulatory Visit
Admission: RE | Admit: 2019-01-22 | Discharge: 2019-01-22 | Disposition: A | Discharge status: Home / Self Care | Payer: Medicare HMO | Source: Ambulatory Visit | Attending: Urology | Admitting: Urology

## 2019-01-22 ENCOUNTER — Ambulatory Visit (HOSPITAL_COMMUNITY)
Admission: RE | Admit: 2019-01-22 | Discharge: 2019-01-22 | Disposition: A | Discharge status: Home / Self Care | Payer: Medicare HMO | Source: Ambulatory Visit | Attending: Urology | Admitting: Urology

## 2019-01-22 ENCOUNTER — Ambulatory Visit
Admission: RE | Admit: 2019-01-22 | Discharge: 2019-01-22 | Disposition: A | Discharge status: Home / Self Care | Payer: Medicare HMO | Source: Ambulatory Visit | Attending: Radiation Oncology | Admitting: Radiation Oncology

## 2019-01-22 ENCOUNTER — Encounter: Payer: Self-pay | Admitting: Medical Oncology

## 2019-01-22 ENCOUNTER — Other Ambulatory Visit: Payer: Self-pay

## 2019-01-22 ENCOUNTER — Encounter: Payer: Self-pay | Admitting: Urology

## 2019-01-22 VITALS — BP 150/68 | HR 63 | Temp 97.8°F | Resp 18 | Wt 174.2 lb

## 2019-01-22 DIAGNOSIS — C61 Malignant neoplasm of prostate: Secondary | ICD-10-CM

## 2019-01-22 MED ORDER — PHENAZOPYRIDINE HCL 200 MG PO TABS: 200.0000 mg | ORAL_TABLET | Freq: Three times a day (TID) | ORAL | 1 refills | Status: AC | PRN

## 2019-01-22 NOTE — Progress Notes (Addendum)
Mr Flitter is here for a post-seed follow-up appointment today. He has a MRI scheduled for today. Patient states that he has an appointment with  his urologist tomorrow. Dysuria  Yes Hematuria No Noctuira x 4 Empties his bladder with urination Yes  Stream Weak Leakage Yes Urgency Yes Bowels no issues Vitals:   01/22/19 0926  BP: (!) 150/68  Pulse: 63  Resp: 18  Temp: 97.8 F (36.6 C)  TempSrc: Oral  SpO2: 96%

## 2019-01-22 NOTE — Progress Notes (Signed)
Radiation Oncology         [phone] ________________________________  Name: Sean Lucas MRN: [id number]  Date: 01/22/2019  DOB: 04/01/1941  Post-Seed Follow-Up Visit Note  CC: Penni Bombard, PA  Annetta Maw, MD  Diagnosis:   78 y.o. gentleman with Stage T1c adenocarcinoma of the prostate with Gleason score of 4+3, and PSA of 7.55.    ICD-10-CM   1. Malignant neoplasm of prostate ([facility])  C61     Interval Since Last Radiation:  2 weeks 01/09/19:  Insertion of radioactive I-125 seeds into the prostate gland; 145 Gy, definitive/boost therapy with placement of SpaceOAR gel.  Narrative:  The patient returns today for routine follow-up.  He is complaining of increased urinary frequency and urinary hesitation symptoms. He filled out a questionnaire regarding urinary function today providing and overall IPSS score of 25 characterizing his symptoms as severe with increased frequency, urgency, weak stream, straining to void, intermittency and nocturia x4 per night.  He continues with mild dysuria at the start of his stream, particularly when straining to start.  He is taking Flomax daily and has been using AZO with minimal relief.  He denies gross hematuria, suprapubic discomfort, fever or chills.  His pre-implant score was 6. He denies any abdominal pain or bowel symptoms.    ALLERGIES:  is allergic to lipitor [atorvastatin] and simvastatin.  Meds: Current Outpatient Medications  Medication Sig Dispense Refill  . aspirin 81 MG tablet Take 81 mg by mouth daily.     . Cholecalciferol (VITAMIN D-3) 1000 UNITS CAPS Take 1,000 Units by mouth daily.    . cloNIDine (CATAPRES) 0.1 MG tablet Take 1 tablet by mouth 3 (three) times daily.     Marland Kitchen docusate sodium (COLACE) 100 MG capsule Take 100 mg by mouth 2 (two) times daily.    . eszopiclone (LUNESTA) 2 MG TABS tablet Take 2 mg by mouth at bedtime as needed for sleep. Take immediately before bedtime (Lunesta)    . HYDROcodone-acetaminophen  (NORCO) 10-325 MG tablet Take 1-2 tablets by mouth every 4 (four) hours as needed for moderate pain. Maximum dose per 24 hours - 8 pills 8 tablet 0  . lisinopril (PRINIVIL,ZESTRIL) 10 MG tablet Take 1 tablet (10 mg total) by mouth daily. (Patient taking differently: Take 10 mg by mouth every evening. ) 90 tablet 1  . meloxicam (MOBIC) 7.5 MG tablet Take 7.5 mg by mouth daily.    . metFORMIN (GLUCOPHAGE) 850 MG tablet Take 850 mg by mouth daily with lunch.     . Multiple Vitamins-Minerals (MULTIVITAMIN WITH MINERALS) tablet Take 1 tablet by mouth daily.    . nitroGLYCERIN (NITROSTAT) 0.4 MG SL tablet Place 0.4 mg under the tongue every 5 (five) minutes x 2 doses as needed for chest pain.    Marland Kitchen omeprazole (PRILOSEC) 20 MG capsule Take 20 mg by mouth daily.     Marland Kitchen oxyCODONE-acetaminophen (PERCOCET) 10-325 MG tablet Take 1 tablet by mouth.     . propranolol (INDERAL) 20 MG tablet Take 20 mg by mouth 2 (two) times daily.     . rosuvastatin (CRESTOR) 20 MG tablet Take 1 tablet (20 mg total) by mouth daily. (Patient taking differently: Take 20 mg by mouth every evening. ) 30 tablet 6  . tamsulosin (FLOMAX) 0.4 MG CAPS capsule Take 0.4 mg by mouth daily after breakfast.     . venlafaxine (EFFEXOR) 75 MG tablet Take 75 mg by mouth 2 (two) times daily with a meal. Takes one tablet  in am and 1/2 tablet at bedtime     No current facility-administered medications for this encounter.     Physical Findings: In general this is a well appearing Caucasian male in no acute distress. He's alert and oriented x4 and appropriate throughout the examination. Cardiopulmonary assessment is negative for acute distress and he exhibits normal effort.   Lab Findings: Lab Results  Component Value Date   WBC 7.8 01/06/2019   HGB 13.4 01/06/2019   HCT 40.7 01/06/2019   MCV 94.0 01/06/2019   PLT 241 01/06/2019    Radiographic Findings:  Patient underwent CT imaging in our clinic for post implant dosimetry. The CT will be  reviewed by Dr. Tammi Klippel to confirm there is an adequate distribution of radioactive seeds throughout the prostate gland and ensure that there are no seeds in or near the rectum. His scheduled for prostate MRI this afternoon at 1pm and those images will be fused with his CT images for further evaluation. We suspect the final radiation plan and dosimetry will show appropriate coverage of the prostate gland. He understands that we will call and inform him of any unexpected findings on further review of his imaging and dosimetry.  Impression/Plan: 78 y.o. gentleman with Stage T1c adenocarcinoma of the prostate with Gleason score of 4+3, and PSA of 7.55. The patient is recovering from the effects of radiation. I have sent a prescription for Pyridium 200 mg to his pharmacy to use as needed. His urinary symptoms should gradually improve over the next 4-6 months. We talked about this today. He is encouraged by his gradual improvement already and is otherwise pleased with his outcome. We also talked about long-term follow-up for prostate cancer following seed implant. He understands that ongoing PSA determinations and digital rectal exams will help perform surveillance to rule out disease recurrence. He has a follow up appointment scheduled with Dr. Karsten Ro on 01/23/19. He understands what to expect with his PSA measures. Patient was also educated today about some of the long-term effects from radiation including a small risk for rectal bleeding and possibly erectile dysfunction. We talked about some of the general management approaches to these potential complications. However, I did encourage the patient to contact our office or return at any point if he has questions or concerns related to his previous radiation and prostate cancer.    Nicholos Johns, PA-C

## 2019-01-22 NOTE — Progress Notes (Signed)
  Radiation Oncology         [phone] ________________________________  Name: Sean Lucas MRN: [id number]  Date: 01/22/2019  DOB: [birth year]  COMPLEX SIMULATION NOTE  NARRATIVE:  The patient was brought to the [facility] today following prostate seed implantation approximately one month ago.  Identity was confirmed.  All relevant records and images related to the planned course of therapy were reviewed.  Then, the patient was set-up supine.  CT images were obtained.  The CT images were loaded into the planning software.  Then the prostate and rectum were contoured.  Treatment planning then occurred.  The implanted iodine 125 seeds were identified by the physics staff for projection of radiation distribution  I have requested : 3D Simulation  I have requested a DVH of the following structures: Prostate and rectum.    ________________________________  Sheral Apley Tammi Klippel, M.D.

## 2019-02-10 ENCOUNTER — Encounter: Payer: Self-pay | Admitting: Radiation Oncology

## 2019-02-10 DIAGNOSIS — C61 Malignant neoplasm of prostate: Secondary | ICD-10-CM | POA: Diagnosis not present

## 2019-03-07 NOTE — Progress Notes (Signed)
  Radiation Oncology         [phone]) [phone] ________________________________  Name: Sean Lucas MRN: [id number]  Date: 02/10/2019  DOB: 24-Apr-1940  3D Planning Note   Prostate Brachytherapy Post-Implant Dosimetry  Diagnosis: 78 y.o. gentleman with Stage T1c adenocarcinoma of the prostate with Gleason score of 4+3, and PSA of 7.55   Narrative: On a previous date, Sean Lucas returned following prostate seed implantation for post implant planning. He underwent CT scan complex simulation to delineate the three-dimensional structures of the pelvis and demonstrate the radiation distribution.  Since that time, the seed localization, and complex isodose planning with dose volume histograms have now been completed.  Results:   Prostate Coverage - The dose of radiation delivered to the 90% or more of the prostate gland (D90) was 106.95% of the prescription dose. This exceeds our goal of greater than 90%. Rectal Sparing - The volume of rectal tissue receiving the prescription dose or higher was 0.79 cc. This falls under our thresholds tolerance of 1.0 cc.  Impression: The prostate seed implant appears to show adequate target coverage and appropriate rectal sparing.  Plan:  The patient will continue to follow with urology for ongoing PSA determinations. I would anticipate a high likelihood for local tumor control with minimal risk for rectal morbidity.  ________________________________  Sheral Apley Tammi Klippel, M.D.

## 2020-02-10 IMAGING — CR CHEST - 2 VIEW
2 series · 2 of 2 positions shown · non-contrast
Comparison: 07/21/2016 and prior radiographs

CLINICAL DATA: 78-year-old male for preoperative chest radiograph
prior to prostate cancer seed placement.

EXAM:
CHEST - 2 VIEW

[w chest pa]
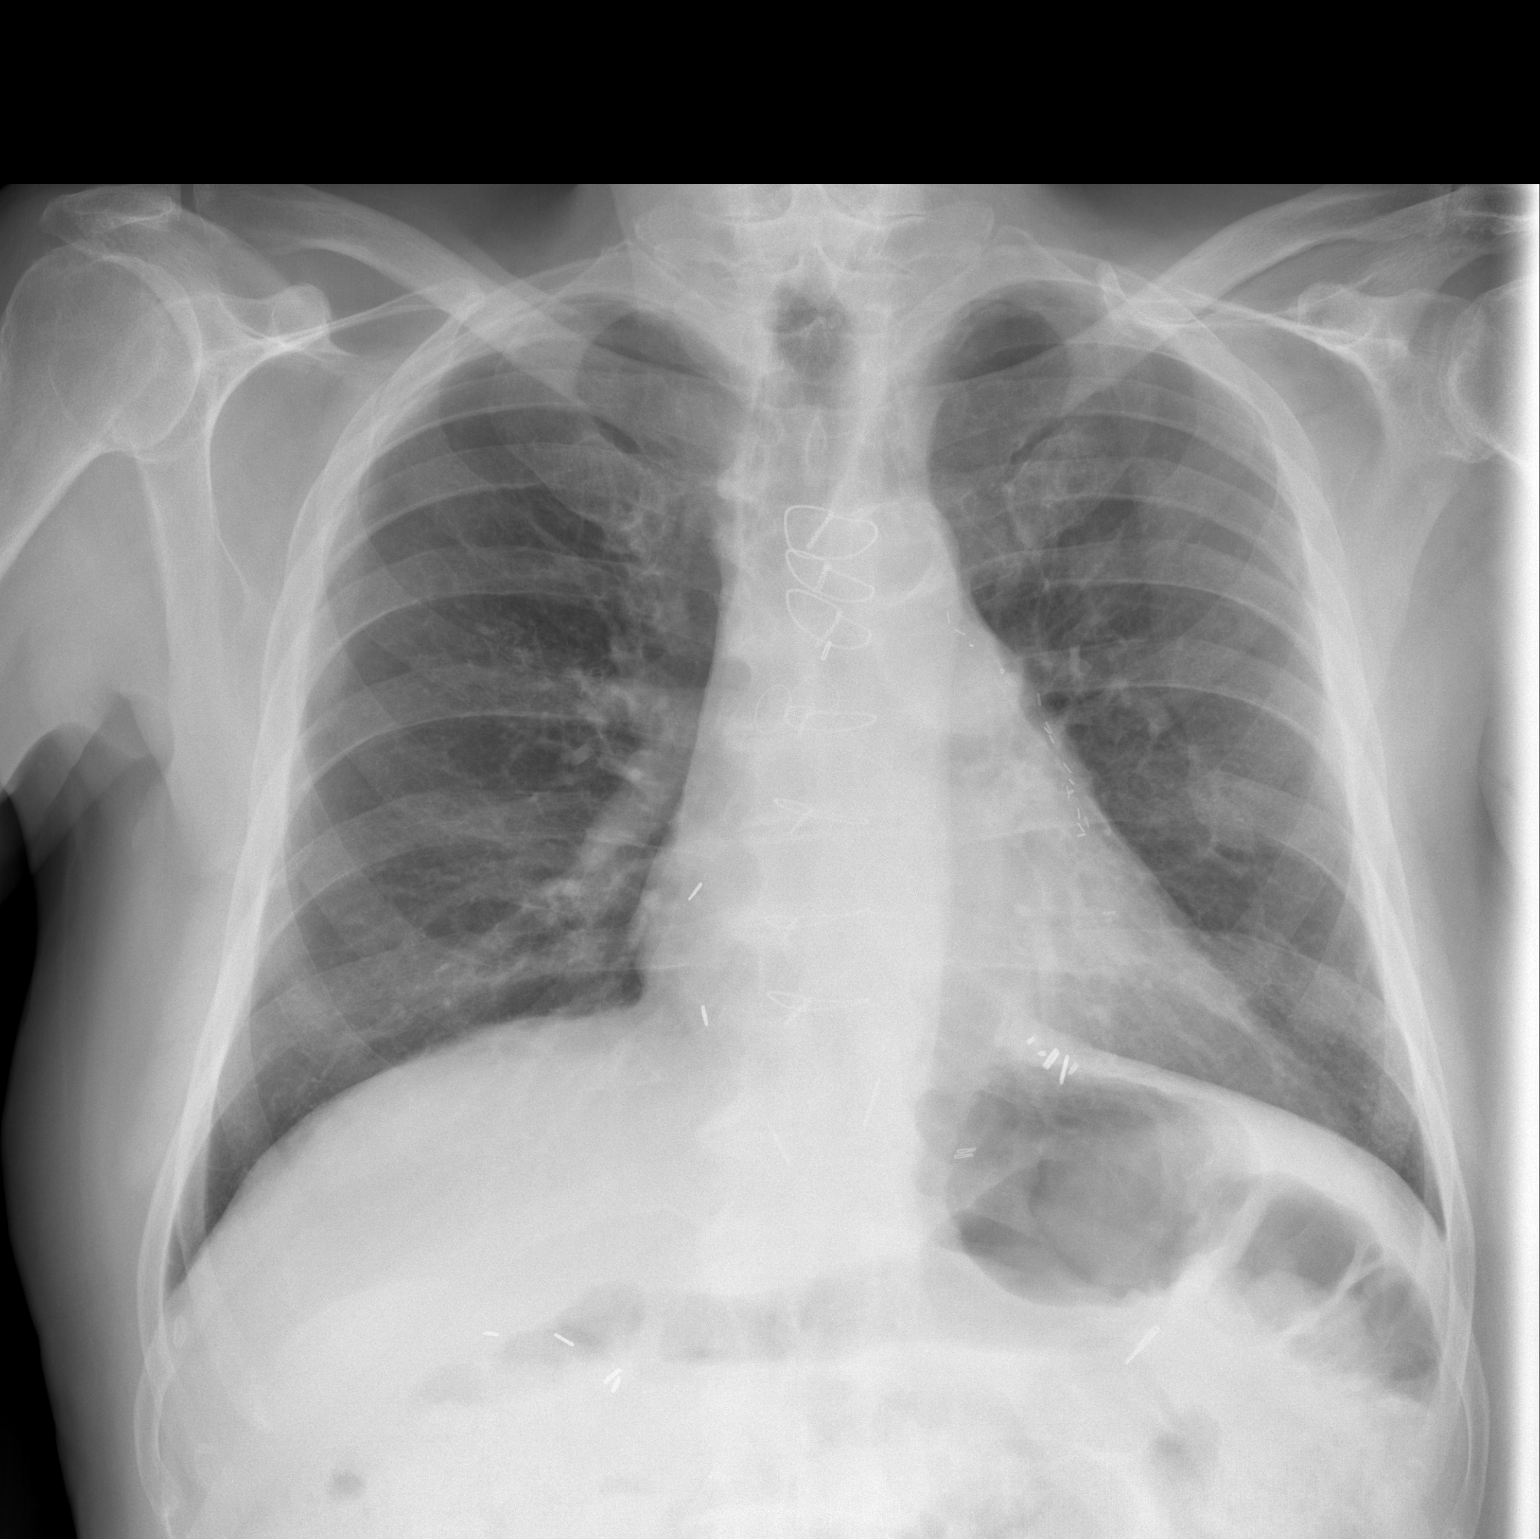

[w chest lat]
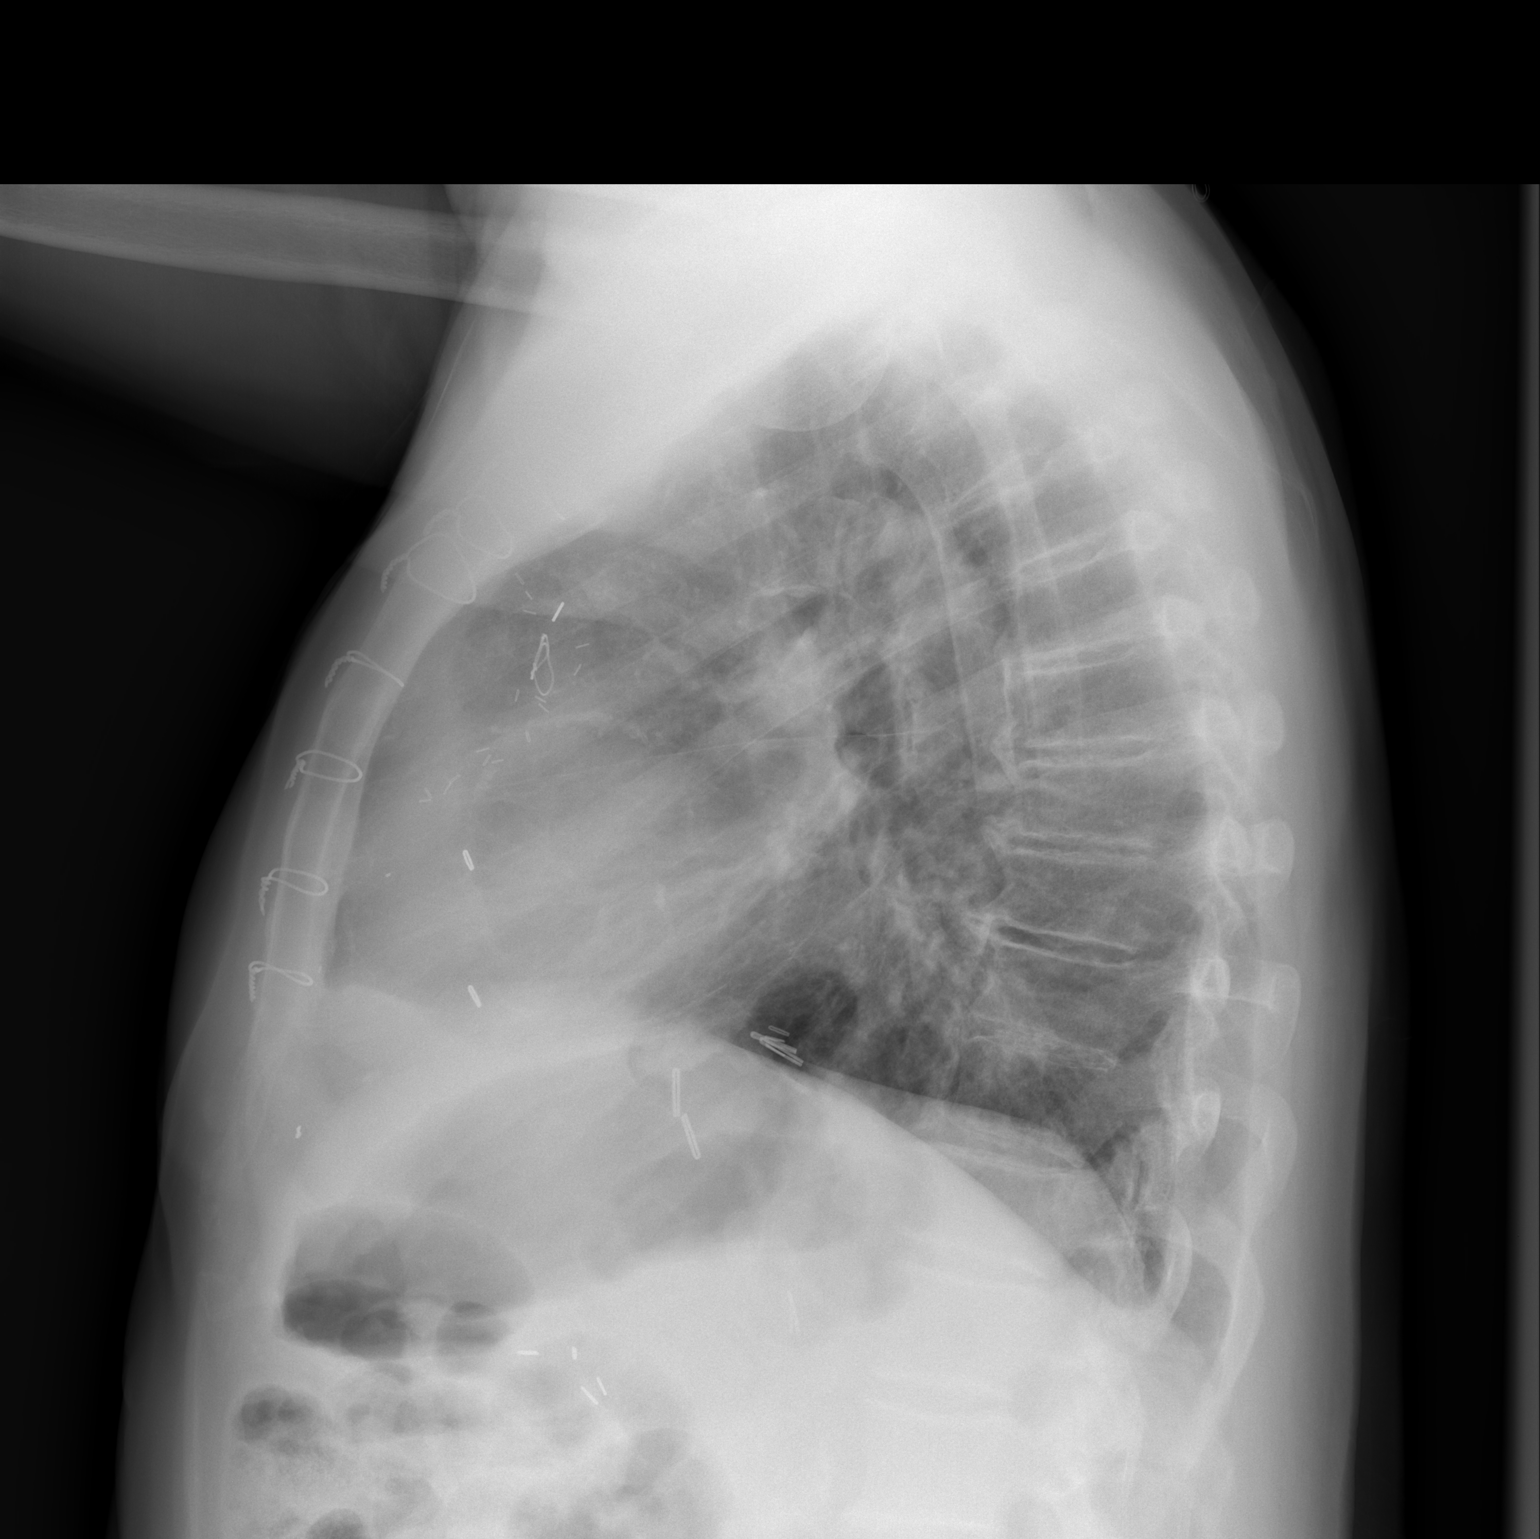

[2 of 2 positions shown; findings below may reference images not displayed]

FINDINGS: The cardiomediastinal silhouette is unremarkable.

CABG changes again noted.

There is no evidence of focal airspace disease, pulmonary edema,
suspicious pulmonary nodule/mass, pleural effusion, or pneumothorax.

No acute bony abnormalities are identified.
IMPRESSION: No active cardiopulmonary disease.
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
